# Patient Record
Sex: Female | Born: 1970 | Race: Black or African American | Hispanic: No | Marital: Single | Smoking: Current some day smoker
Health system: Southern US, Community
[De-identification: ages and names within clinical notes are randomized; demographics above are authoritative.]

## PROBLEM LIST (undated history)

## (undated) DIAGNOSIS — Z8042 Family history of malignant neoplasm of prostate: Secondary | ICD-10-CM

## (undated) DIAGNOSIS — Z780 Asymptomatic menopausal state: Secondary | ICD-10-CM

## (undated) DIAGNOSIS — M1990 Unspecified osteoarthritis, unspecified site: Secondary | ICD-10-CM

## (undated) DIAGNOSIS — Z803 Family history of malignant neoplasm of breast: Secondary | ICD-10-CM

## (undated) DIAGNOSIS — E785 Hyperlipidemia, unspecified: Secondary | ICD-10-CM

## (undated) DIAGNOSIS — R7303 Prediabetes: Secondary | ICD-10-CM

## (undated) DIAGNOSIS — D649 Anemia, unspecified: Secondary | ICD-10-CM

## (undated) DIAGNOSIS — Z8052 Family history of malignant neoplasm of bladder: Secondary | ICD-10-CM

## (undated) DIAGNOSIS — R42 Dizziness and giddiness: Secondary | ICD-10-CM

## (undated) DIAGNOSIS — F419 Anxiety disorder, unspecified: Secondary | ICD-10-CM

## (undated) DIAGNOSIS — T7840XA Allergy, unspecified, initial encounter: Secondary | ICD-10-CM

## (undated) DIAGNOSIS — N809 Endometriosis, unspecified: Secondary | ICD-10-CM

## (undated) DIAGNOSIS — F338 Other recurrent depressive disorders: Secondary | ICD-10-CM

## (undated) DIAGNOSIS — H9193 Unspecified hearing loss, bilateral: Secondary | ICD-10-CM

## (undated) HISTORY — DX: Unspecified hearing loss, bilateral: H91.93

## (undated) HISTORY — DX: Other recurrent depressive disorders: F33.8

## (undated) HISTORY — DX: Prediabetes: R73.03

## (undated) HISTORY — DX: Family history of malignant neoplasm of bladder: Z80.52

## (undated) HISTORY — DX: Anxiety disorder, unspecified: F41.9

## (undated) HISTORY — PX: SALPINGECTOMY: SHX328

## (undated) HISTORY — PX: OOPHORECTOMY: SHX6387

## (undated) HISTORY — DX: Unspecified osteoarthritis, unspecified site: M19.90

## (undated) HISTORY — DX: Endometriosis, unspecified: N80.9

## (undated) HISTORY — DX: Asymptomatic menopausal state: Z78.0

## (undated) HISTORY — DX: Dizziness and giddiness: R42

## (undated) HISTORY — PX: CERVICAL BIOPSY  W/ LOOP ELECTRODE EXCISION: SUR135

## (undated) HISTORY — DX: Anemia, unspecified: D64.9

## (undated) HISTORY — DX: Allergy, unspecified, initial encounter: T78.40XA

## (undated) HISTORY — DX: Hyperlipidemia, unspecified: E78.5

## (undated) HISTORY — DX: Family history of malignant neoplasm of breast: Z80.3

## (undated) HISTORY — DX: Family history of malignant neoplasm of prostate: Z80.42

---

## 1999-07-12 ENCOUNTER — Other Ambulatory Visit: Admission: RE | Admit: 1999-07-12 | Discharge: 1999-07-12 | Payer: Self-pay | Admitting: Obstetrics and Gynecology

## 1999-07-12 ENCOUNTER — Other Ambulatory Visit: Admission: RE | Admit: 1999-07-12 | Discharge: 1999-07-12 | Payer: Self-pay | Admitting: *Deleted

## 1999-11-01 ENCOUNTER — Encounter: Payer: Self-pay | Admitting: Obstetrics and Gynecology

## 1999-11-01 ENCOUNTER — Ambulatory Visit (HOSPITAL_COMMUNITY): Admission: RE | Admit: 1999-11-01 | Discharge: 1999-11-01 | Payer: Self-pay | Admitting: Obstetrics and Gynecology

## 2000-08-15 ENCOUNTER — Other Ambulatory Visit: Admission: RE | Admit: 2000-08-15 | Discharge: 2000-08-15 | Payer: Self-pay | Admitting: *Deleted

## 2000-10-20 ENCOUNTER — Observation Stay (HOSPITAL_COMMUNITY): Admission: AD | Admit: 2000-10-20 | Discharge: 2000-10-21 | Payer: Self-pay | Admitting: Obstetrics & Gynecology

## 2000-10-20 ENCOUNTER — Encounter (INDEPENDENT_AMBULATORY_CARE_PROVIDER_SITE_OTHER): Payer: Self-pay | Admitting: Specialist

## 2000-12-23 ENCOUNTER — Ambulatory Visit (HOSPITAL_COMMUNITY): Admission: RE | Admit: 2000-12-23 | Discharge: 2000-12-23 | Payer: Self-pay | Admitting: Obstetrics & Gynecology

## 2000-12-23 ENCOUNTER — Encounter: Payer: Self-pay | Admitting: Obstetrics & Gynecology

## 2001-08-14 ENCOUNTER — Other Ambulatory Visit: Admission: RE | Admit: 2001-08-14 | Discharge: 2001-08-14 | Payer: Self-pay | Admitting: Obstetrics and Gynecology

## 2002-08-19 ENCOUNTER — Other Ambulatory Visit: Admission: RE | Admit: 2002-08-19 | Discharge: 2002-08-19 | Payer: Self-pay | Admitting: Obstetrics and Gynecology

## 2007-02-14 ENCOUNTER — Inpatient Hospital Stay (HOSPITAL_COMMUNITY): Admission: AD | Admit: 2007-02-14 | Discharge: 2007-02-14 | Payer: Self-pay | Admitting: Obstetrics and Gynecology

## 2007-12-17 ENCOUNTER — Ambulatory Visit (HOSPITAL_COMMUNITY): Admission: RE | Admit: 2007-12-17 | Discharge: 2007-12-17 | Payer: Self-pay | Admitting: Obstetrics and Gynecology

## 2009-05-27 IMAGING — US US OB COMP LESS 14 WK
1 series · 14 of 28 positions shown · non-contrast
Comparison: none

CLINICAL DATA: Abnormal uterine bleeding. 
 OBSTETRICAL ULTRASOUND <14 WKS AND TRANSVAGINAL OB US:
TECHNIQUE: Both transabdominal and transvaginal ultrasound examinations were performed for complete evaluation of the gestation as well as the maternal uterus, adnexal regions, and pelvic cul-de-sac.

[Series 1: us ob comp +14 wk · 14 of 48 slices shown]
[im 2/48]
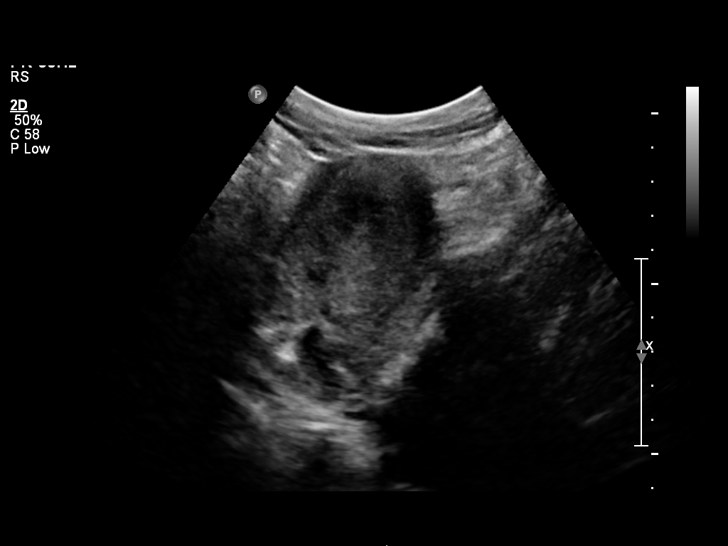
[im 6/48]
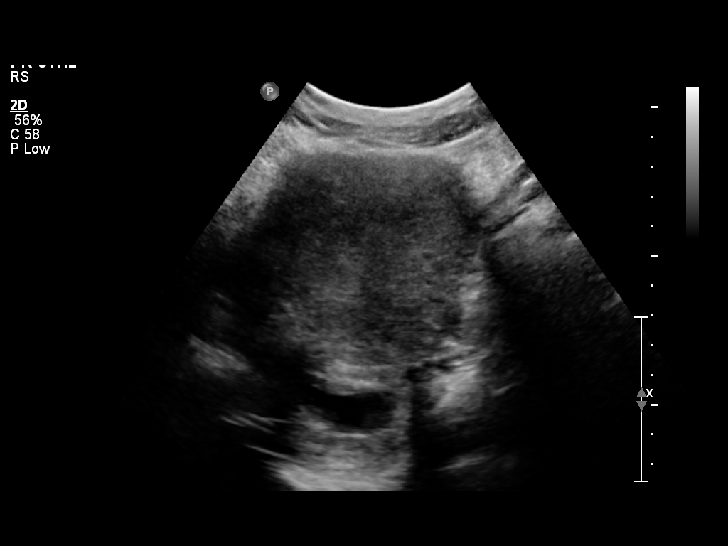
[im 9/48]
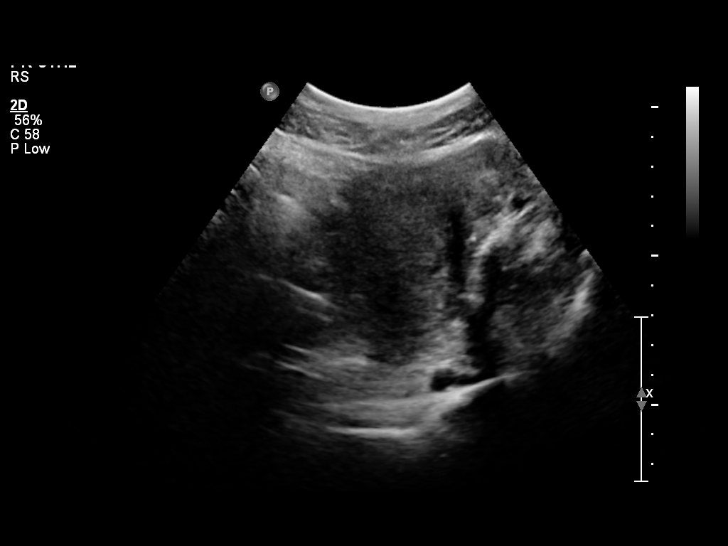
[im 13/48]
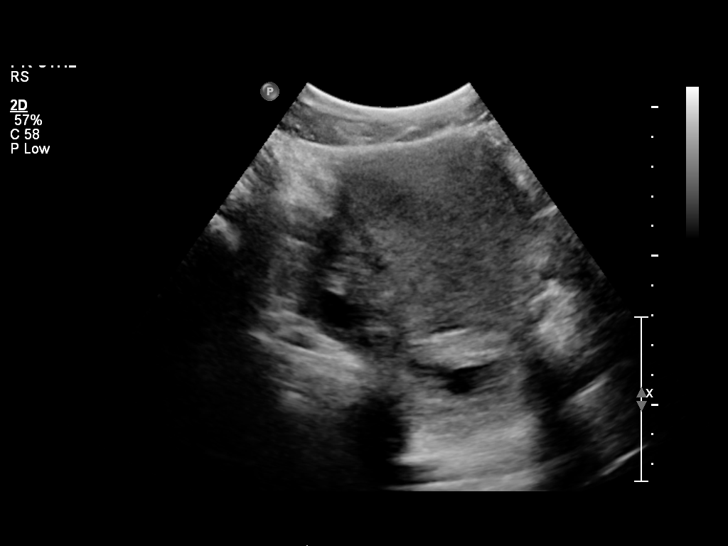
[im 16/48]
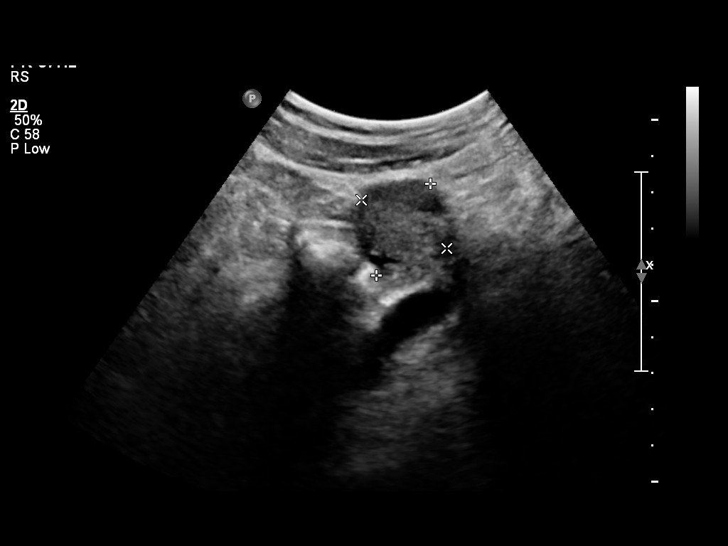
[im 20/48]
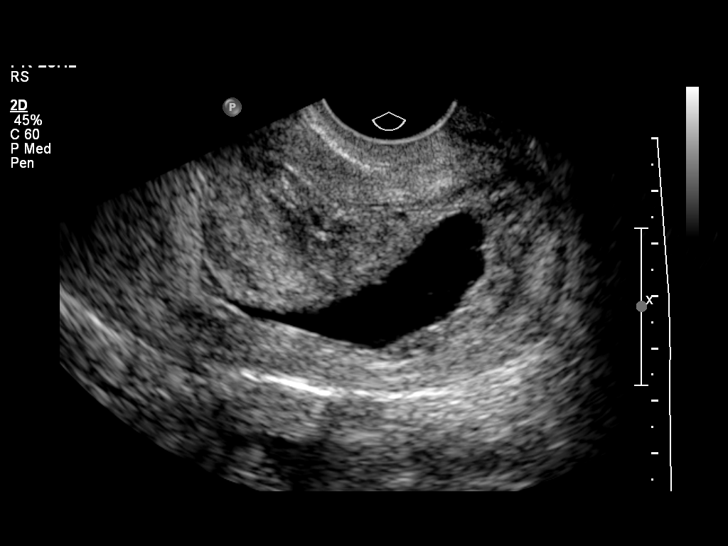
[im 23/48]
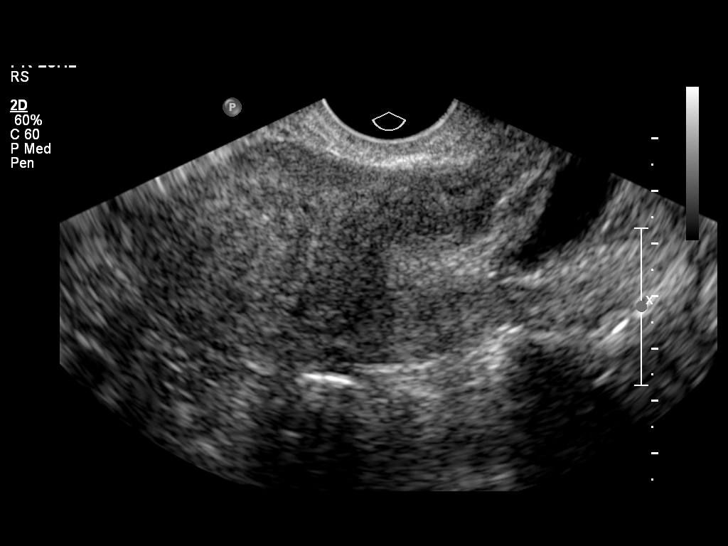
[im 27/48]
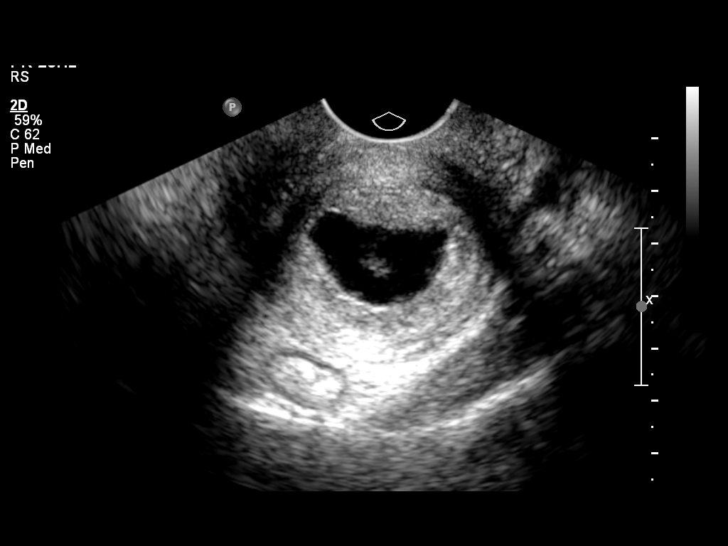
[im 30/48]
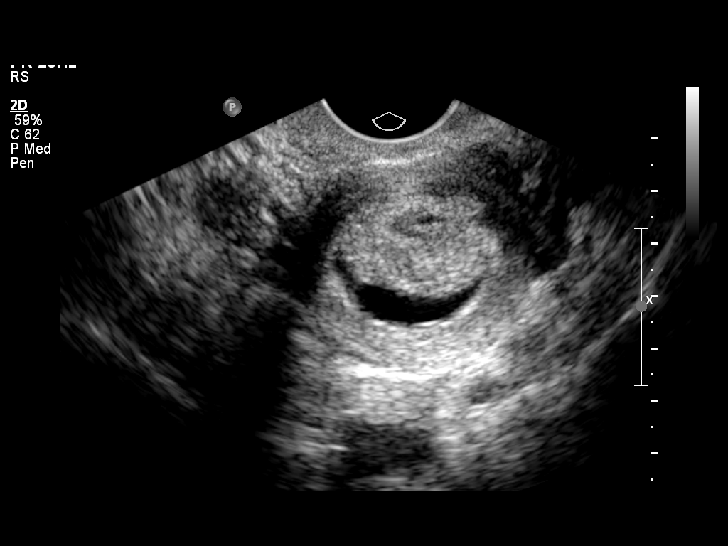
[im 34/48]
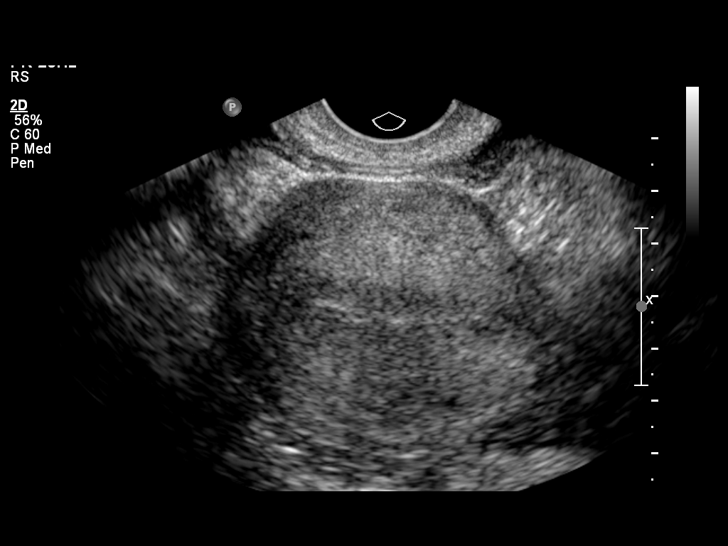
[im 37/48]
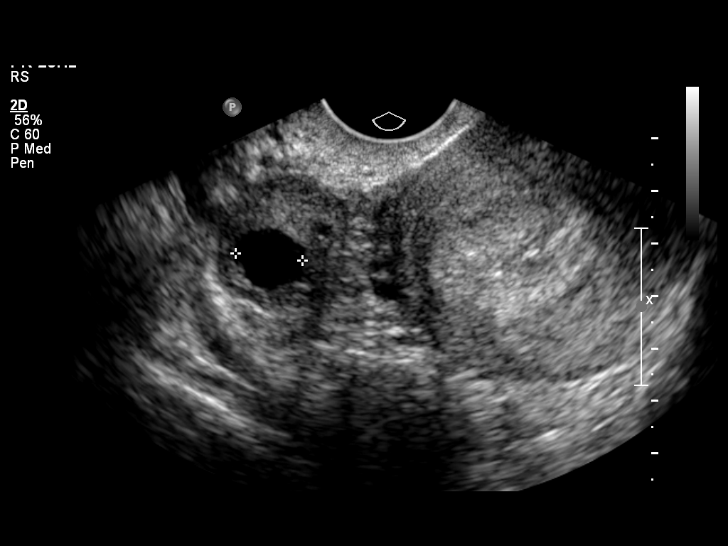
[im 41/48]
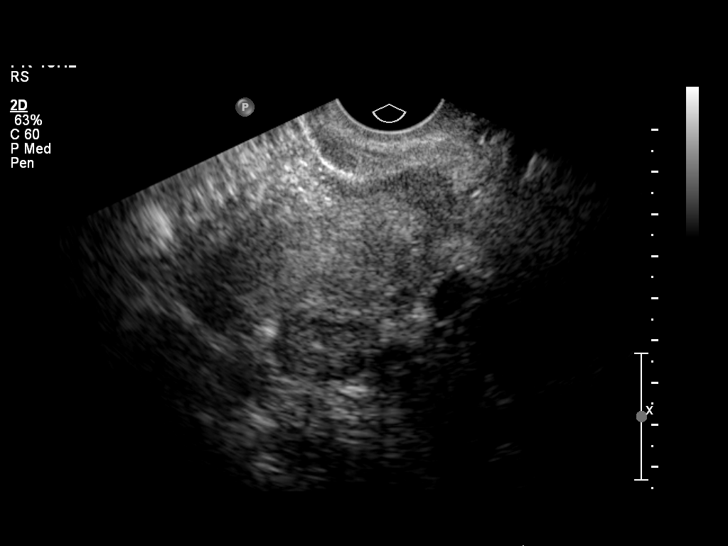
[im 44/48]
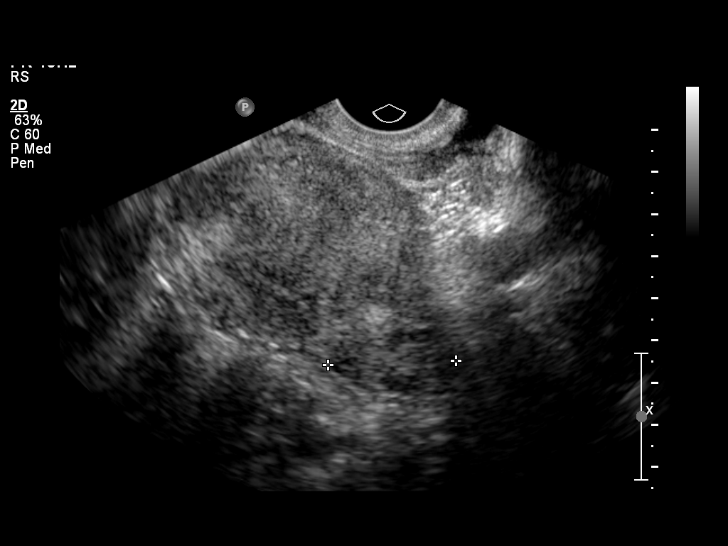
[im 48/48]
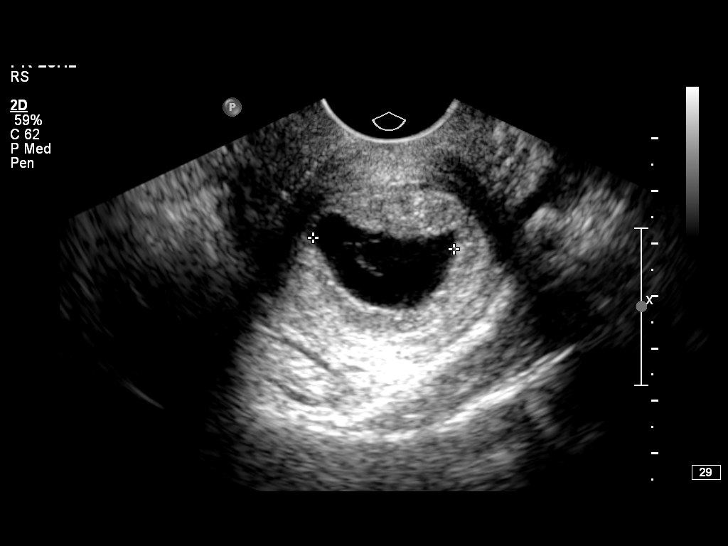

[14 of 28 positions shown; findings below may reference images not displayed]

FINDINGS: There is a single intrauterine gestational sac.  The sac is deformed and extended into the lower uterine segment with the sac extending into slightly dilated upper cervix.  Yolk sac is visible, but there is no visible embryo and no cardiac activity. Mean sac diameter is 2.9 cm consistent with 8 weeks 1 day.  
 The ovaries are normal with a corpus luteum cyst on the right.  No free fluid.
IMPRESSION: Spontaneous abortion in progress.  No visible fetus.  Gestational sac is in the dilated lower uterine segment and upper cervix.

## 2009-09-18 ENCOUNTER — Ambulatory Visit (HOSPITAL_COMMUNITY): Admission: RE | Admit: 2009-09-18 | Discharge: 2009-09-18 | Payer: Self-pay | Admitting: Obstetrics and Gynecology

## 2010-07-20 NOTE — Op Note (Signed)
[facility]  Patient:    NIKOLETTE, REINDL Visit Number: [id number] MRN: [id number]          Service Type: DSU Location: [id number] Attending Physician:  Genia Del Proc. Date: 10/20/00 Adm. Date:  10/20/2000                             Operative Report  DATE OF BIRTH:                December 19, 1970.  PREOPERATIVE DIAGNOSES:       1. Left adnexal mass, probable hydrosalpinx.                               2. Left pelvic pain.  POSTOPERATIVE DIAGNOSES:      1. Left adnexal mass, probable hydrosalpinx.                               2. Left pelvic pain.                               3. Left hydrosalpinx with adhesions                                  (more than 3 cm in diameter).                               4. Right mild hydrosalpinx.                               5. Bilateral proximal tubal obstructions.                               6. Left ovarian cyst (around 5 cm).                               7. Epiploic parietal and left epiploic adnexal,                                  left intestinal adnexal, and bilateral                                  tubo-ovarian adhesions. Adhesions were severe                                  and dense.  OPERATION:                    1. Diagnostic laparoscopy.                               2. Left salpingectomy.  3. Left ovarian cystectomy.                               4. Extensive lysis of adhesions.                               5. Chromopertubation with methylene blue.   SURGEON:                      Genia Del, M.D.  ASSISTANT:                    Pershing Cox, M.D.  ANESTHESIOLOGIST:             Gretta Cool., M.D.  ANESTHESIA:                   General.  ESTIMATED BLOOD LOSS:         Less than 100 cc.  DESCRIPTION OF PROCEDURE:     Under general anesthesia with endotracheal intubation, the patient is in lithotomy position for operative  laparoscopy. She is prepped with Betadine on the abdominal, suprapubic, vulvar, and vaginal areas. The vaginal exam reveals an anteverted uterus, mobile. The right adnexa is normal. The left adnexa is increased in volume, about 9 cm, but soft. The speculum is inserted. The anterior lip of the cervix is grasped with a tenaculum and the uterus is cannulated. The speculum is removed. Abdominally an infraumbilical incision is made with a scalpel over 10 mm. The Veress needle is inserted while raising the abdominal wall. The security tests are done and a pneumoperitoneum is created with around 3 liters of CO2. Once achieved, the Veress needle is removed. The trocar is inserted and then the laparoscope. Exploration of the pelvic cavity reveals extensive adhesions involving the epiploon with the parietal wall anteriorly and to the right. The epiploon is also adherent to the left adnexa. 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 We then make a suprapubic incision over a 5 mm with the scalpel and insert a 5 mm trocar. We make a third incision in the left inguinal area with a scalpel and then introduce another 5 mm trocar. The instruments are inserted and atraumatic clamp and the Rotary scissors with unipolar. We then proceed with lysis of adhesions between the epiploon and the parietal wall. We use the unipolar when necessary. We then have a better vision of the pelvic cavity. The uterus is normal in appearance and size. The left adnexa is adherent to the epiploon and to the sigmoid. We proceed to lyse those adhesions. We then have a severe hydrosalpinx on that side with an ovarian cyst. The left tube is severely adherent to the ovary. On the right side, the hydrosalpinx is mild but the tube is adherent to the ovary. The ovary is normal in appearance and size. We lyse the adhesions on the right side  and that gives some motility to the tube, although no fimbria are seen and the distal end appears closed.  We proceed with hydrotubation with methylene blue and absolutely no methylene blue passes in the tube on either side. There is a proximal obstruction bilaterally. The decision is therefore taken to proceed with left salpingectomy. We use the tripolar cauterizing, cutting successively. We also proceed with  the left ovarian cystectomy. Clear fluid is present inside the ovarian cyst. The specimen is put in a bag and is removed through a 10 mm trocar at the suprapubic area. Hemostasis is adequate on the left ovary.  We then proceed with hydrotubation with methylene blue once more and confirm a proximal obstruction of the right tube. A distal tuboplasty is therefore not done. Hemostasis is adequate in the pelvic cavity. Irrigation and suction was done. We remove the instruments, evacuate the CO2, and pictures were taken. The estimated blood loss was less than 100 cc. No complication occurred and the patient was transferred to recovery room in good status. Attending Physician:  Genia Del DD:  10/20/00 TD:  10/21/00 Job: [id number]

## 2010-12-10 LAB — WET PREP, GENITAL
Trich, Wet Prep: NONE SEEN
Yeast Wet Prep HPF POC: NONE SEEN

## 2010-12-10 LAB — CBC
HCT: 27.7 — ABNORMAL LOW
Hemoglobin: 9.8 — ABNORMAL LOW
MCHC: 35.4
RDW: 14.2

## 2010-12-10 LAB — POCT PREGNANCY, URINE
Operator id: 140111
Preg Test, Ur: POSITIVE

## 2010-12-10 LAB — URINALYSIS, ROUTINE W REFLEX MICROSCOPIC
Bilirubin Urine: NEGATIVE
Ketones, ur: NEGATIVE
Nitrite: NEGATIVE
pH: 6

## 2010-12-10 LAB — HCG, QUANTITATIVE, PREGNANCY: hCG, Beta Chain, Quant, S: 11929 — ABNORMAL HIGH

## 2010-12-10 LAB — URINE MICROSCOPIC-ADD ON

## 2011-09-30 ENCOUNTER — Other Ambulatory Visit (HOSPITAL_COMMUNITY): Payer: Self-pay | Admitting: Obstetrics and Gynecology

## 2011-09-30 DIAGNOSIS — Z1231 Encounter for screening mammogram for malignant neoplasm of breast: Secondary | ICD-10-CM

## 2011-10-17 ENCOUNTER — Ambulatory Visit (HOSPITAL_COMMUNITY)
Admission: RE | Admit: 2011-10-17 | Discharge: 2011-10-17 | Disposition: A | Discharge status: Home / Self Care | Payer: Self-pay | Source: Ambulatory Visit | Attending: Obstetrics and Gynecology | Admitting: Obstetrics and Gynecology

## 2011-10-17 DIAGNOSIS — Z1231 Encounter for screening mammogram for malignant neoplasm of breast: Secondary | ICD-10-CM

## 2013-02-19 ENCOUNTER — Other Ambulatory Visit (HOSPITAL_COMMUNITY): Payer: Self-pay | Admitting: Obstetrics and Gynecology

## 2013-02-19 DIAGNOSIS — Z1231 Encounter for screening mammogram for malignant neoplasm of breast: Secondary | ICD-10-CM

## 2013-03-16 ENCOUNTER — Ambulatory Visit (HOSPITAL_COMMUNITY)
Admission: RE | Admit: 2013-03-16 | Discharge: 2013-03-16 | Disposition: A | Discharge status: Home / Self Care | Payer: Medicaid Other | Source: Ambulatory Visit | Attending: Obstetrics and Gynecology | Admitting: Obstetrics and Gynecology

## 2013-03-16 DIAGNOSIS — Z1231 Encounter for screening mammogram for malignant neoplasm of breast: Secondary | ICD-10-CM

## 2015-04-10 ENCOUNTER — Emergency Department (HOSPITAL_COMMUNITY)
Admission: EM | Admit: 2015-04-10 | Discharge: 2015-04-10 | Disposition: A | Discharge status: Home / Self Care | Payer: Medicaid Other | Attending: Emergency Medicine | Admitting: Emergency Medicine

## 2015-04-10 ENCOUNTER — Encounter (HOSPITAL_COMMUNITY): Payer: Self-pay | Admitting: *Deleted

## 2015-04-10 DIAGNOSIS — H9192 Unspecified hearing loss, left ear: Secondary | ICD-10-CM | POA: Insufficient documentation

## 2015-04-10 DIAGNOSIS — F17210 Nicotine dependence, cigarettes, uncomplicated: Secondary | ICD-10-CM | POA: Insufficient documentation

## 2015-04-10 MED ORDER — AMOXICILLIN 500 MG PO CAPS: 500.0000 mg | ORAL_CAPSULE | Freq: Three times a day (TID) | ORAL | Status: DC

## 2015-04-10 MED FILL — AMOXICILLIN 500 MG CAPSULE: 500 | 10 days supply | Qty: 30 | Fill #0

## 2015-04-10 NOTE — Care Management Note (Signed)
Case Management Note  Patient Details  Name: Yolanda Keller MRN: [id number] Date of Birth: 25-Sep-1970  Subjective/Objective:                  45 y.o. female with a hx of vertigo who presents to the Emergency Department complaining of bilateral hearing loss for the past one week that worsened today/  Home with child.  Action/Plan: Follow for disposition needs.   Expected Discharge Date:        04/10/15          Expected Discharge Plan:  Home/Self Care  In-House Referral:     Discharge planning Services  CM Consult, Follow-up appt scheduled, GCCN / P4HM (established/new)  Post Acute Care Choice:  NA Choice offered to:  Patient  DME Arranged:  N/A DME Agency:  NA  HH Arranged:  NA HH Agency:  NA  Status of Service:  Completed, signed off  Medicare Important Message Given:    Date Medicare IM Given:    Medicare IM give by:    Date Additional Medicare IM Given:    Additional Medicare Important Message give by:     If discussed at [place] of Stay Meetings, dates discussed:    Additional Comments: Yolanda Keller J. Clydene Laming, RN, BSN, [place] [phone] ED CM consulted regarding PCP establishment and insurance enrollment. Pt presented to [facility] ED today with hearing loss. NCM met with pt at bedside; pt confirms not having access to f/u care with PCP or insurance coverage. Discussed with patient importance and benefits of establishing PCP, and not utilizing the ED for primary care needs. Pt verbalized understanding and is in agreement. Discussed other options, provided list of local  affordable PCPs.  Pt voiced interest in the [facility] and [place].  NCM advised that [facility]  Internal Medicine providers are seeing pts at [facility]. Pt verbalized understanding. NCM set up appointment at [facility] with Cammie Sickle, NP on 05/09/15 at 2:15.  NCM informed pt they may visit [place] and [place] for Rx needs upon discharge.  Saintclair Halsted, [place] Specialist visited pt at bedside to speak with pt about orange card application process.  Pt verbalized understanding.  Fuller Mandril, RN 04/10/2015, 3:11 PM

## 2015-04-10 NOTE — Discharge Instructions (Signed)
Hearing Loss Hearing loss is a partial or total loss of the ability to hear. This can be temporary or permanent, and it can happen in one or both ears. Hearing loss may be referred to as deafness. Medical care is necessary to treat hearing loss properly and to prevent the condition from getting worse. Your hearing may partially or completely come back, depending on what caused your hearing loss and how severe it is. In some cases, hearing loss is permanent. CAUSES Common causes of hearing loss include:   Too much wax in the ear canal.   Infection of the ear canal or middle ear.   Fluid in the middle ear.   Injury to the ear or surrounding area.   An object stuck in the ear.   Prolonged exposure to loud sounds, such as music.  Less common causes of hearing loss include:   Tumors in the ear.   Viral or bacterial infections, such as meningitis.   A hole in the eardrum (perforated eardrum).  Problems with the hearing nerve that sends signals between the brain and the ear.  Certain medicines.  SYMPTOMS  Symptoms of this condition may include:  Difficulty telling the difference between sounds.  Difficulty following a conversation when there is background noise.  Lack of response to sounds in your environment. This may be most noticeable when you do not respond to startling sounds.  Needing to turn up the volume on the television, radio, etc.  Ringing in the ears.  Dizziness.  Pain in the ears. DIAGNOSIS This condition is diagnosed based on a physical exam and a hearing test (audiometry). The audiometry test will be performed by a hearing specialist (audiologist). You may also be referred to an ear, nose, and throat (ENT) specialist (otolaryngologist).  TREATMENT Treatment for recent onset of hearing loss may include:   Ear wax removal.   Being prescribed medicines to prevent infection (antibiotics).   Being prescribed medicines to reduce inflammation  (corticosteroids).  HOME CARE INSTRUCTIONS  If you were prescribed an antibiotic medicine, take it as told by your health care provider. Do not stop taking the antibiotic even if you start to feel better.  Take over-the-counter and prescription medicines only as told by your health care provider.  Avoid loud noises.   Return to your normal activities as told by your health care provider. Ask your health care provider what activities are safe for you.  Keep all follow-up visits as told by your health care provider. This is important. SEEK MEDICAL CARE IF:   You feel dizzy.   You develop new symptoms.   You vomit or feel nauseous.   You have a fever.  SEEK IMMEDIATE MEDICAL CARE IF:  You develop sudden changes in your vision.   You have severe ear pain.   You have new or increased weakness.  You have a severe headache.   This information is not intended to replace advice given to you by your health care provider. Make sure you discuss any questions you have with your health care provider.   Document Released: 02/18/2005 Document Revised: 11/09/2014 Document Reviewed: 07/06/2014 Elsevier Interactive Patient Education 2016 Elsevier Inc. Meniere Disease Meniere disease is an inner ear disorder. It causes attacks of a spinning sensation (vertigo) and ringing in the ear (tinnitus). It also causes hearing loss and a sensation of fullness or pressure in your ear.  Meniere disease is lifelong. It may get worse over time. Symptoms usually begin in one ear but may  eventually affect both ears.  CAUSES Meniere disease is caused by having too much of the fluid that is in your inner ear (endolymph). When endolymph builds up in your inner ear, it affects the nerves that control balance and hearing. The reason for the endolymph buildup is not known. Possible causes include:  Allergy.  An abnormal reaction of the body's defense system (autoimmune disease).  Viral infection of the  inner ear.  Head injury. RISK FACTORS  Age older than 40 years.  Family history of Meniere disease.  History of autoimmune disease.  History of migraine headaches. SIGNS AND SYMPTOMS Symptoms of Meniere disease can come and go and may last for up to 4 hours at a time. Symptoms usually start in one ear and may become more frequent and eventually involve both ears. Symptoms can include:  Fullness and pressure in your ear.  Roaring or ringing in your ear.  Vertigo and loss of balance.  Decreased hearing.  Nausea and vomiting. DIAGNOSIS Your health care provider will perform a physical exam. Tests may be done to confirm a diagnosis of Meniere disease. These tests may include:  A hearing test (audiogram).  An electronystagmogram. This tests your balance nerve (vestibular nerve).  Imaging studies, such as CT or MRI, of your inner ear. TREATMENT There is no cure for Meniere disease, but it can be managed. Management may include:  A diet that may help relieve symptoms of Meniere disease.  Use of medicines to reduce:  Vertigo.  Nausea.  Fluid retention.  Use of an air pressure pulse generator. This is a machine that sends small pressure pulses into your ear canal.  Inner ear surgery (rare). When you experience symptoms, it can be helpful to lie down on a flat surface and focus your eyes on one object that does not move. Try to stay in that position until your symptoms go away.  HOME CARE INSTRUCTIONS   Take medicines only as directed by your health care provider.  Eat the same amount of food at the same time every day, including snacks.  Do not skip meals.  Limit the salt in your diet to 1,000 mg a day.  Avoid caffeine.  Limit alcoholic drinks to one drink a day.  Do not eat foods containing monosodium glutamate (MSG).  Drink enough fluids to keep your urine clear or pale yellow.  Do not use any tobacco products including cigarettes, chewing tobacco, or  electronic cigarettes. If you need help quitting, ask your health care provider.  Find ways to reduce or avoid stress. SEEK MEDICAL CARE IF:   You have symptoms that last longer than 4 hours.  You have new or more severe symptoms. SEEK IMMEDIATE MEDICAL CARE IF:   You have been vomiting for 24 hours.  You are not able to keep fluids down.  You have chest pain or trouble breathing.   This information is not intended to replace advice given to you by your health care provider. Make sure you discuss any questions you have with your health care provider.   Document Released: 02/16/2000 Document Revised: 03/11/2014 Document Reviewed: 02/01/2013 Elsevier Interactive Patient Education 2016 Elsevier Inc. Otitis Media With Effusion Otitis media with effusion is the presence of fluid in the middle ear. This is a common problem in children, which often follows ear infections. It may be present for weeks or longer after the infection. Unlike an acute ear infection, otitis media with effusion refers only to fluid behind the ear drum and not  infection. Children with repeated ear and sinus infections and allergy problems are the most likely to get otitis media with effusion. CAUSES  The most frequent cause of the fluid buildup is dysfunction of the eustachian tubes. These are the tubes that drain fluid in the ears to the back of the nose (nasopharynx). SYMPTOMS   The main symptom of this condition is hearing loss. As a result, you or your child may:  Listen to the TV at a loud volume.  Not respond to questions.  Ask "what" often when spoken to.  Mistake or confuse one sound or word for another.  There may be a sensation of fullness or pressure but usually not pain. DIAGNOSIS   Your health care provider will diagnose this condition by examining you or your child's ears.  Your health care provider may test the pressure in you or your child's ear with a tympanometer.  A hearing test may be  conducted if the problem persists. TREATMENT   Treatment depends on the duration and the effects of the effusion.  Antibiotics, decongestants, nose drops, and cortisone-type drugs (tablets or nasal spray) may not be helpful.  Children with persistent ear effusions may have delayed language or behavioral problems. Children at risk for developmental delays in hearing, learning, and speech may require referral to a specialist earlier than children not at risk.  You or your child's health care provider may suggest a referral to an ear, nose, and throat surgeon for treatment. The following may help restore normal hearing:  Drainage of fluid.  Placement of ear tubes (tympanostomy tubes).  Removal of adenoids (adenoidectomy). HOME CARE INSTRUCTIONS   Avoid secondhand smoke.  Infants who are breastfed are less likely to have this condition.  Avoid feeding infants while they are lying flat.  Avoid known environmental allergens.  Avoid people who are sick. SEEK MEDICAL CARE IF:   Hearing is not better in 3 months.  Hearing is worse.  Ear pain.  Drainage from the ear.  Dizziness. MAKE SURE YOU:   Understand these instructions.  Will watch your condition.  Will get help right away if you are not doing well or get worse.   This information is not intended to replace advice given to you by your health care provider. Make sure you discuss any questions you have with your health care provider.   Document Released: 03/28/2004 Document Revised: 03/11/2014 Document Reviewed: 09/15/2012 Elsevier Interactive Patient Education [employer]

## 2015-04-10 NOTE — ED Provider Notes (Signed)
CSN: [id number]     Arrival date & time 04/10/15  1325 History  By signing my name below, I, Linus Galas, attest that this documentation has been prepared under the direction and in the presence of Arthor Captain, PA-C. Electronically Signed: Linus Galas, ED Scribe. 04/10/2015. 2:33 PM.  The history is provided by the patient. No language interpreter was used.   HPI Comments:, Yolanda Keller is a 45 y.o. female with a hx of vertigo who presents to the Emergency Department complaining of bilateral hearing loss for the past one week that worsened today, PTA. She states she has greater hearing loss in her left ear than her right. Pt reports that she feels as if her ears are clogged despite keeping them clean to the best of her ability. Pt denies any exacerbated or alleviating factors. She tried using "ear drops" to alleviate her sx with no relief. Pt denies any fevers, nasal congestion, dizziness, visual changes, difficulty swallowing, one-sided weakness, or any other sx.   Pt states she may have seen a doctor for her vertigo a long time ago and does not know what type of vertigo she has. Pt denies being prescribed any medication for her vertigo.   History reviewed. No pertinent past medical history. Past Surgical History  Procedure Laterality Date  . Oophorectomy Left    History reviewed. No pertinent family history. Social History  Substance Use Topics  . Smoking status: Current Some Day Smoker    Types: Cigars  . Smokeless tobacco: Never Used  . Alcohol Use: Yes     Comment: social   OB History    No data available     Review of Systems  Constitutional: Negative for fever.  HENT: Positive for hearing loss. Negative for congestion, ear pain and trouble swallowing.   Eyes: Negative for visual disturbance.  Neurological: Negative for weakness.  Psychiatric/Behavioral: Negative for confusion.   Allergies  Review of patient's allergies indicates not on file.  Home  Medications   Prior to Admission medications   Medication Sig Start Date End Date Taking? Authorizing Provider  amoxicillin (AMOXIL) 500 MG capsule Take 1 capsule (500 mg total) by mouth 3 (three) times daily. 04/10/15   Arthor Captain, PA-C   BP 107/67 mmHg  Pulse 77  Temp(Src) 98.1 F (36.7 C) (Oral)  Resp 20  SpO2 99%  LMP 04/03/2015 Physical Exam  Constitutional: She is oriented to person, place, and time. She appears well-developed and well-nourished.  HENT:  Head: Normocephalic and atraumatic.  Right TM normal Left TM is dull and erythematous with air fluid behind it.   Cardiovascular: Normal rate.   Pulmonary/Chest: Effort normal.  Abdominal: She exhibits no distension.  Neurological: She is alert and oriented to person, place, and time.  Speech is clear and goal oriented, follows commands Major Cranial nerves without deficit, no facial droop Normal strength in upper and lower extremities bilaterally including dorsiflexion and plantar flexion, strong and equal grip strength Sensation normal to light and sharp touch Moves extremities without ataxia, coordination intact Normal finger to nose and rapid alternating movements Neg romberg, no pronator drift Normal gait Normal heel-shin and balance   Skin: Skin is warm and dry.  Psychiatric: She has a normal mood and affect.  Nursing note and vitals reviewed.   ED Course  Procedures  DIAGNOSTIC STUDIES: Oxygen Saturation is 99% on room air, normal by my interpretation.    COORDINATION OF CARE: 2:25 PM Will give Rx for Amoxicillin. Discussed treatment plan with  pt at bedside and pt agreed to plan.  Labs Review Labs Reviewed - No data to display  Imaging Review No results found.   EKG Interpretation None      MDM   Final diagnoses:  Difficulty hearing, left   Patient with decreased hearing. Likely mennieres given her hx. ? Effusion. Will treat for infection follow up with ENT. No other neuro deficits  I  personally performed the services described in this documentation, which was scribed in my presence. The recorded information has been reviewed and is accurate.      Arthor Captain, PA-C 04/12/15 1930  Jerelyn Scott, MD 05/10/15 [phone]

## 2015-04-10 NOTE — ED Notes (Signed)
Pt reports change in hearing to Lt ear.

## 2015-05-09 ENCOUNTER — Encounter: Payer: Self-pay | Admitting: Family Medicine

## 2015-05-09 ENCOUNTER — Ambulatory Visit (INDEPENDENT_AMBULATORY_CARE_PROVIDER_SITE_OTHER): Payer: No Typology Code available for payment source | Admitting: Family Medicine

## 2015-05-09 VITALS — BP 101/69 | HR 79 | Temp 98.0°F | Resp 18 | Ht 67.0 in | Wt 154.0 lb

## 2015-05-09 DIAGNOSIS — Z8669 Personal history of other diseases of the nervous system and sense organs: Secondary | ICD-10-CM

## 2015-05-09 DIAGNOSIS — K029 Dental caries, unspecified: Secondary | ICD-10-CM

## 2015-05-09 DIAGNOSIS — G629 Polyneuropathy, unspecified: Secondary | ICD-10-CM

## 2015-05-09 DIAGNOSIS — Z833 Family history of diabetes mellitus: Secondary | ICD-10-CM

## 2015-05-09 DIAGNOSIS — H9193 Unspecified hearing loss, bilateral: Secondary | ICD-10-CM

## 2015-05-09 DIAGNOSIS — R631 Polydipsia: Secondary | ICD-10-CM

## 2015-05-09 DIAGNOSIS — Z87898 Personal history of other specified conditions: Secondary | ICD-10-CM | POA: Insufficient documentation

## 2015-05-09 DIAGNOSIS — R634 Abnormal weight loss: Secondary | ICD-10-CM

## 2015-05-09 LAB — POCT URINALYSIS DIP (DEVICE)
Bilirubin Urine: NEGATIVE
Glucose, UA: NEGATIVE mg/dL
HGB URINE DIPSTICK: NEGATIVE
KETONES UR: NEGATIVE mg/dL
Leukocytes, UA: NEGATIVE
NITRITE: NEGATIVE
PROTEIN: NEGATIVE mg/dL
SPECIFIC GRAVITY, URINE: 1.025 (ref 1.005–1.030)
UROBILINOGEN UA: 0.2 mg/dL (ref 0.0–1.0)
pH: 5.5 (ref 5.0–8.0)

## 2015-05-09 LAB — TSH: TSH: 1.18 mIU/L

## 2015-05-09 NOTE — Progress Notes (Signed)
Subjective:    Patient ID: Yolanda Keller, female    DOB: February 12, 1971, 45 y.o.   MRN: [id number]  HPI Ms. Yolanda CornerKeshea Keller, a 45 year old female presents to establish care. Patient states that she has not had a primary provider due to insurance constraints. She has been utilizing urgent care or the emergency department for all primary needs. Ms. Yolanda Keller is complaining of bilateral hearing loss for greater than 1 month. She states that hearing loss in the left ear is greater than the right. She was evaluated in the emergency department on 04/10/2015. She endorses a history of vertigo, but denies headache, fever, fatigue, ear pain, sinus pain/pressure, environmental allergies, dizziness, syncope,  Weakness, or palpitations. She maintains that she does not use ear buds/headphones, and does not use Q-tips. She states that she attempted OTC ear drops to alleviate symptoms without relief. She was also started on amoxicillin in the emergency department without relief. She does not endorse a history of meningitis, or strep throat. She also denies head injury  There has not been a history of a neck mass.  Past Medical History  Diagnosis Date  . Hearing loss of both ears    .No Known Allergies  Social History   Social History  . Marital Status: Single    Spouse Name: N/A  . Number of Children: N/A  . Years of Education: N/A   Occupational History  . Not on file.   Social History Main Topics  . Smoking status: Current Some Day Smoker    Types: Cigars  . Smokeless tobacco: Never Used  . Alcohol Use: Yes     Comment: social  . Drug Use: No  . Sexual Activity: Not on file   Other Topics Concern  . Not on file   Social History Narrative   Past Surgical History  Procedure Laterality Date  . Oophorectomy Left    Review of Systems  Constitutional: Negative for fever, fatigue and unexpected weight change.  HENT: Positive for hearing loss (bilaterally).   Eyes: Negative.    Respiratory: Negative.  Negative for shortness of breath.   Cardiovascular: Negative.   Gastrointestinal: Negative.   Endocrine: Negative for polydipsia, polyphagia and polyuria.  Musculoskeletal: Negative.   Skin: Negative.   Allergic/Immunologic: Negative for environmental allergies and immunocompromised state.  Neurological: Positive for dizziness (history of vertigo). Negative for tremors, seizures, facial asymmetry, weakness and headaches.  Hematological: Negative.   Psychiatric/Behavioral: Negative.  Negative for suicidal ideas and sleep disturbance.       Depression-seasonal         Objective:   Physical Exam  Constitutional: She is oriented to person, place, and time. She appears well-developed and well-nourished.  HENT:  Head: Normocephalic and atraumatic.  Right Ear: External ear and ear canal normal. No drainage or swelling. No mastoid tenderness. Tympanic membrane is scarred (Landmarks not visualized. TM cloudy). Decreased hearing is noted.  Left Ear: External ear and ear canal normal. No drainage or swelling. No mastoid tenderness. Tympanic membrane is not injected and not scarred. Decreased hearing is noted.  Nose: Nose normal.  Mouth/Throat: Oropharynx is clear and moist.  Rinne: Bone conduction was heard longer than air conduction in ears bilaterally  Weber: Inconclusive  Patient responds at 40 db HL in ears bilaterally  Eyes: Conjunctivae and EOM are normal. Pupils are equal, round, and reactive to light.  Neck: Normal range of motion. Neck supple.  Cardiovascular: Normal rate, regular rhythm, normal heart sounds and intact distal pulses.  Pulmonary/Chest: Effort normal and breath sounds normal.  Abdominal: Soft. Bowel sounds are normal.  Musculoskeletal: Normal range of motion.  Neurological: She is alert and oriented to person, place, and time. She has normal reflexes.  Skin: Skin is warm and dry.  Psychiatric: She has a normal mood and affect. Her behavior  is normal. Judgment and thought content normal.      BP 101/69 mmHg  Pulse 79  Temp(Src) 98 F (36.7 C) (Oral)  Resp 18  Ht  (1.702 m)  Wt 154 lb (69.854 kg)  BMI 24.11 kg/m2  SpO2 100%  LMP 04/28/2015 (Exact Date) Assessment & Plan:  1. History of vertigo Orthostatic vital signs are within normal limits. She states that she has not had dizziness in greater than 1 month.  - Orthostatic vital signs  2. Decreased hearing of both ears Audiometry performed. Hearing diminished bilaterally. 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 She responds to 40 db HL in both ears per audioscope screening.  I will defer to ENT for further evaluation.  - Ambulatory referral to ENT  3. Polydipsia Patient has a history of type 2 diabetes in first degree family members.  - POCT urinalysis dipstick - Hemoglobin A1c - COMPLETE METABOLIC PANEL WITH GFR  4. Family history of diabetes mellitus - Hemoglobin A1c  5. Loss of weight  - TSH  6. Neuropathy (HCC)  - HIV antibody (with reflex)  7. Dental caries  - Ambulatory referral to Dentistry  Routine Health Maintenance: Recommend that patient return in 6 months for pap smear  Recommend screening mammogram Recommend a lowfat, low carbohydrate diet divided over 5-6 small meals, increase water intake to 6-8 glasses, and 150 minutes per week of cardiovascular exercise.    Yolanda Keller M, FNP    The patient was given clear instructions to go to ER or return to medical center if symptoms do not improve, worsen or new problems develop. The patient verbalized understanding. Will notify patient with laboratory results.

## 2015-05-09 NOTE — Patient Instructions (Signed)
Hearing Loss Hearing loss is a partial or total loss of the ability to hear. This can be temporary or permanent, and it can happen in one or both ears. Hearing loss may be referred to as deafness. Medical care is necessary to treat hearing loss properly and to prevent the condition from getting worse. Your hearing may partially or completely come back, depending on what caused your hearing loss and how severe it is. In some cases, hearing loss is permanent. CAUSES Common causes of hearing loss include:   Too much wax in the ear canal.   Infection of the ear canal or middle ear.   Fluid in the middle ear.   Injury to the ear or surrounding area.   An object stuck in the ear.   Prolonged exposure to loud sounds, such as music.  Less common causes of hearing loss include:   Tumors in the ear.   Viral or bacterial infections, such as meningitis.   A hole in the eardrum (perforated eardrum).  Problems with the hearing nerve that sends signals between the brain and the ear.  Certain medicines.  SYMPTOMS  Symptoms of this condition may include:  Difficulty telling the difference between sounds.  Difficulty following a conversation when there is background noise.  Lack of response to sounds in your environment. This may be most noticeable when you do not respond to startling sounds.  Needing to turn up the volume on the television, radio, etc.  Ringing in the ears.  Dizziness.  Pain in the ears. DIAGNOSIS This condition is diagnosed based on a physical exam and a hearing test (audiometry). The audiometry test will be performed by a hearing specialist (audiologist). You may also be referred to an ear, nose, and throat (ENT) specialist (otolaryngologist).  TREATMENT Treatment for recent onset of hearing loss may include:   Ear wax removal.   Being prescribed medicines to prevent infection (antibiotics).   Being prescribed medicines to reduce inflammation  (corticosteroids).  HOME CARE INSTRUCTIONS  If you were prescribed an antibiotic medicine, take it as told by your health care provider. Do not stop taking the antibiotic even if you start to feel better.  Take over-the-counter and prescription medicines only as told by your health care provider.  Avoid loud noises.   Return to your normal activities as told by your health care provider. Ask your health care provider what activities are safe for you.  Keep all follow-up visits as told by your health care provider. This is important. SEEK MEDICAL CARE IF:   You feel dizzy.   You develop new symptoms.   You vomit or feel nauseous.   You have a fever.  SEEK IMMEDIATE MEDICAL CARE IF:  You develop sudden changes in your vision.   You have severe ear pain.   You have new or increased weakness.  You have a severe headache.   This information is not intended to replace advice given to you by your health care provider. Make sure you discuss any questions you have with your health care provider.   Document Released: 02/18/2005 Document Revised: 11/09/2014 Document Reviewed: 07/06/2014 Elsevier Interactive Patient Education 2016 [employer]. Screening for Type 2 Diabetes Screening is a way to check for type 2 diabetes in people who do not have symptoms of the disease, but who may likely develop diabetes in the future. Diabetes can lead to serious health problems, but finding diabetes early allows for early treatment. DIABETES RISK FACTORS   Family history of  diabetes.  Diseases of the pancreas.  Obesity or being overweight.  Certain racial or ethnic groups:  American [place].  Pacific Islander.  Hispanic.  Asian.  African American.  High blood pressure (hypertension).  History of diabetes while pregnant (gestational diabetes).  Delivering a baby that weighed over 9 pounds.  Being inactive.  High cholesterol or triglycerides.  Age, especially over 2145  years of age.  Other diseases or conditions.  Diseases of the pancreas.  Cardiovascular disease.  Disorders of the endocrine system.  Certain medicines, such as those that treat high blood cholesterol levels. WHO IS SCREENED Adults  Adults who have no risk factors and no symptoms should be screened starting at age 45. If the screening tests are normal, they should be repeated every 3 years.  Adults who do not have symptoms, but have 1 or more risk factors, should be screened.  Adults who have 2 or more risk factors may be screened every year.  Adults who have an A1c (3 month average of blood glucose) greater than 5.7% or who had an impaired glucose tolerance (IGT) or impaired fasting glucose (IFG) on a previous test should be screened.  Pregnant women who have risk factors should be screened at their first prenatal visit.  Women who have given birth and had gestational diabetes should be screened 6-12 weeks after the child is born. This screening should be repeated every 1-3 years after the first test. Children or Adolescents  Children and adolescents should be screened for type 2 diabetes if they are overweight and have 2 of the following risk factors:  Having a family history of type 2 diabetes.  Being a member of a high risk race or ethnic group.  Having signs of insulin resistance or conditions associated with insulin resistance.  Having a mother who had gestational diabetes while pregnant with him or her.  Screening should start at age 45 or at the onset of puberty, whichever comes first. This should be repeated every 2 years. SCREENING In a screening, your caregiver may:  Ask questions about your overall health. This will include questions about the health of close family members, too.  Ask about any diabetes-like symptoms you may have.  Perform a physical exam.  Order some tests that may include:  A fasting plasma glucose test. This measures the level of  glucose in your blood. It is done after you have had nothing to eat but water (fasted) for 8 hours.  A random blood glucose test. This test is done without the need to fast.  An oral glucose tolerance test. This is a blood test done in 2 parts. First, a blood sample is taken after you have fasted. Then, another sample is taken after you drink a liquid that contains a lot of sugar.  An A1c test. This test shows how much glucose has been in your blood over the past 2 to 3 months.   This information is not intended to replace advice given to you by your health care provider. Make sure you discuss any questions you have with your health care provider.   Document Released: 12/15/2008 Document Revised: 03/11/2014 Document Reviewed: 09/26/2010 Elsevier Interactive Patient Education [employer]2016 Elsevier Inc.

## 2015-05-09 NOTE — Progress Notes (Signed)
Patient is here to establish care.  Patient complains of not being able to hear out of her left ear a month ago. Patient took an antibiotic which gave no relief.  Patient denies any ringing in hear or pain.

## 2015-05-10 ENCOUNTER — Telehealth: Payer: Self-pay

## 2015-05-10 ENCOUNTER — Encounter: Payer: Self-pay | Admitting: Family Medicine

## 2015-05-10 LAB — COMPLETE METABOLIC PANEL WITH GFR
ALT: 20 U/L (ref 6–29)
AST: 17 U/L (ref 10–30)
Albumin: 4.5 g/dL (ref 3.6–5.1)
Alkaline Phosphatase: 44 U/L (ref 33–115)
BUN: 14 mg/dL (ref 7–25)
CHLORIDE: 101 mmol/L (ref 98–110)
CO2: 23 mmol/L (ref 20–31)
CREATININE: 0.7 mg/dL (ref 0.50–1.10)
Calcium: 9.6 mg/dL (ref 8.6–10.2)
GFR, Est African American: >89 mL/min (ref >=60)
GFR, Est Non African American: >89 mL/min (ref >=60)
GLUCOSE: 78 mg/dL (ref 65–99)
POTASSIUM: 4.1 mmol/L (ref 3.5–5.3)
SODIUM: 135 mmol/L (ref 135–146)
Total Bilirubin: 0.3 mg/dL (ref 0.2–1.2)
Total Protein: 7.6 g/dL (ref 6.1–8.1)

## 2015-05-10 LAB — HEMOGLOBIN A1C
Hgb A1c MFr Bld: 5.3 % (ref <5.7)
MEAN PLASMA GLUCOSE: 105 mg/dL (ref <117)

## 2015-05-10 LAB — HIV ANTIBODY (ROUTINE TESTING W REFLEX): HIV 1&2 Ab, 4th Generation: NONREACTIVE

## 2015-05-10 NOTE — Telephone Encounter (Signed)
Called and left message for patient to return call regarding labs. Thanks!  

## 2015-05-10 NOTE — Telephone Encounter (Signed)
-----   Message from Massie MaroonLachina M Hollis, [place] sent at 05/10/2015  6:46 AM EST ----- Regarding: lab results Please inform patient that all labs are within a normal range. I will send referral to ENT for sudden hearing loss and to dentist. The patient has orange card, please reiterate the orange card referral process.   Thanks ----- Message -----    From: Lab in [facility] Interface    Sent: 05/10/2015   3:25 AM      To: Massie MaroonLachina M Hollis, FNP

## 2015-05-15 ENCOUNTER — Telehealth: Payer: Self-pay | Admitting: *Deleted

## 2015-05-15 NOTE — Telephone Encounter (Signed)
Pt called and stated she emailed a health certificate to Armeniahina email address . Pt was wondering if Armeniahina will fill  that form out for her. Pt states she will fax the form as well. Pt states you can call he if you need more information. Please advise provider. Thanks

## 2015-05-16 ENCOUNTER — Other Ambulatory Visit: Payer: No Typology Code available for payment source

## 2015-05-29 ENCOUNTER — Telehealth: Payer: Self-pay

## 2015-06-14 ENCOUNTER — Ambulatory Visit: Payer: No Typology Code available for payment source | Admitting: Family Medicine

## 2015-07-20 MED FILL — FLUTICASONE PROP 50 MCG SPR: 50 | 30 days supply | Qty: 16 | Fill #0

## 2016-01-24 NOTE — Telephone Encounter (Signed)
ERROR

## 2017-07-29 ENCOUNTER — Other Ambulatory Visit: Payer: Self-pay

## 2017-08-21 ENCOUNTER — Other Ambulatory Visit (HOSPITAL_COMMUNITY): Payer: Self-pay | Admitting: *Deleted

## 2017-08-21 DIAGNOSIS — N644 Mastodynia: Secondary | ICD-10-CM

## 2017-09-18 ENCOUNTER — Ambulatory Visit: Payer: No Typology Code available for payment source

## 2017-09-18 ENCOUNTER — Other Ambulatory Visit (HOSPITAL_COMMUNITY): Payer: Self-pay | Admitting: Obstetrics and Gynecology

## 2017-09-18 ENCOUNTER — Encounter (HOSPITAL_COMMUNITY): Payer: Self-pay

## 2017-09-18 ENCOUNTER — Ambulatory Visit (HOSPITAL_COMMUNITY)
Admission: RE | Admit: 2017-09-18 | Discharge: 2017-09-18 | Disposition: A | Discharge status: Home / Self Care | Payer: Self-pay | Source: Ambulatory Visit | Attending: Obstetrics and Gynecology | Admitting: Obstetrics and Gynecology

## 2017-09-18 ENCOUNTER — Ambulatory Visit
Admission: RE | Admit: 2017-09-18 | Discharge: 2017-09-18 | Disposition: A | Discharge status: Home / Self Care | Payer: PRIVATE HEALTH INSURANCE | Source: Ambulatory Visit | Attending: Obstetrics and Gynecology | Admitting: Obstetrics and Gynecology

## 2017-09-18 ENCOUNTER — Ambulatory Visit
Admission: RE | Admit: 2017-09-18 | Discharge: 2017-09-18 | Disposition: A | Discharge status: Home / Self Care | Payer: Self-pay | Source: Ambulatory Visit | Attending: Obstetrics and Gynecology | Admitting: Obstetrics and Gynecology

## 2017-09-18 VITALS — BP 112/66 | Ht 69.0 in

## 2017-09-18 DIAGNOSIS — N644 Mastodynia: Secondary | ICD-10-CM

## 2017-09-18 DIAGNOSIS — N898 Other specified noninflammatory disorders of vagina: Secondary | ICD-10-CM

## 2017-09-18 DIAGNOSIS — Z01419 Encounter for gynecological examination (general) (routine) without abnormal findings: Secondary | ICD-10-CM

## 2017-09-18 NOTE — Patient Instructions (Signed)
Explained breast self awareness with Yolanda Keller. Let patient know [facility] will cover Pap smears and HPV typing every 5 years unless has a history of abnormal Pap smears. Referred patient to the [facility] of [facility] [place] for a screening mammogram. Appointment scheduled for Thursday, September 18, 2017 at 1350. Let patient know will follow up with her within the next couple weeks with results of Pap and wet prep by phone. Informed patient that the Breast Center will follow-up with her within the next couple of weeks with results of mammogram by letter or phone. Discussed smoking cessation with patient. Referred to the [facility] and gave resources to free [facility] cessation classes at [facility]. Yolanda Keller verbalized understanding.  Beyonce Sawatzky, Kathaleen Maserhristine Poll, RN 1:22 PM

## 2017-09-18 NOTE — Progress Notes (Signed)
Complaints of left breast pain x 2.5-3 weeks that resolved on July 4th.   Pap Smear: Pap smear completed today. Last Pap smear was 4 years ago and normal per patient. Per patient has a history of two abnormal Pap smears that a colposcopy was completed for follow-up. Patient stated the abnormal Pap smears were when she was 4918-47 years old and in her 620's. Per patient has had at least three normal Pap smears since last colposcopy. No Pap smear results are in Epic.  Physical exam: Breasts Breasts symmetrical. No skin abnormalities bilateral breasts. No nipple retraction bilateral breasts. No nipple discharge bilateral breasts. No lymphadenopathy. No lumps palpated bilateral breasts. No complaints of pain or tenderness on exam. [facility]. Appointment scheduled for Thursday, September 18, 2017 at 1350.        Pelvic/Bimanual   Ext Genitalia No lesions, no swelling and no discharge observed on external genitalia.         Vagina Vagina pink and normal texture. No lesions and a white frothy discharge observed in vagina. Wet prep completed.        Cervix Cervix is present. Cervix pink and of normal texture. White frothy discharge observed on cervix.    Uterus Uterus is present and palpable. Uterus in normal position and normal size.        Adnexae Bilateral ovaries present and palpable. No tenderness on palpation.         Rectovaginal No rectal exam completed today since patient had no rectal complaints. No skin abnormalities observed on exam.    Smoking History: Patient is a current smoker. Discussed smoking cessation with patient. Referred to the [facility] and gave resources to free smoking cessation classes at [facility].   Patient Navigation: Patient education provided. Access to services provided for patient through BCCCP program.   Breast and Cervical Cancer Risk Assessment: Patient has a family history of a sister,  maternal aunt, and paternal grandfather having breast cancer. Patient has no known genetic mutations or history of radiation treatment to the chest before age 47. Per patient has a history of cervical dysplasia. Patient has no history of being immunocompromised or DES exposure in-utero.  Risk Assessment    Risk Scores      09/18/2017   Last edited by: Priscille HeidelbergBrannock, Chaunda Vandergriff P, RN   5-year risk: 1.5 %   Lifetime risk: 14.2 %

## 2017-09-19 ENCOUNTER — Encounter (HOSPITAL_COMMUNITY): Payer: Self-pay | Admitting: *Deleted

## 2017-09-19 LAB — CYTOLOGY - PAP
Bacterial vaginitis: POSITIVE — AB
CANDIDA VAGINITIS: NEGATIVE
Diagnosis: NEGATIVE
HPV: NOT DETECTED
Trichomonas: NEGATIVE

## 2017-09-22 ENCOUNTER — Telehealth (HOSPITAL_COMMUNITY): Payer: Self-pay | Admitting: *Deleted

## 2017-09-22 ENCOUNTER — Other Ambulatory Visit (HOSPITAL_COMMUNITY): Payer: Self-pay | Admitting: *Deleted

## 2017-09-22 MED ORDER — METRONIDAZOLE 500 MG PO TABS: 500.0000 mg | ORAL_TABLET | Freq: Two times a day (BID) | ORAL | 0 refills | Status: DC

## 2017-09-22 NOTE — Telephone Encounter (Signed)
Telephoned patient at home number and advised patient of negative pap smear results. HPV was negative. Next pap smear due in five years. Advised patient wet prep did show bacterial vaginosis and [facility] [address]. Patient to finish all medication and no alcohol while taking medication. Patient voiced understanding.

## 2017-10-06 ENCOUNTER — Other Ambulatory Visit (HOSPITAL_COMMUNITY): Payer: Self-pay | Admitting: *Deleted

## 2017-10-06 DIAGNOSIS — Z0000 Encounter for general adult medical examination without abnormal findings: Secondary | ICD-10-CM

## 2017-10-08 ENCOUNTER — Inpatient Hospital Stay: Payer: PRIVATE HEALTH INSURANCE

## 2017-10-08 ENCOUNTER — Inpatient Hospital Stay: Payer: Medicaid Other | Attending: Obstetrics and Gynecology | Admitting: *Deleted

## 2017-10-08 VITALS — BP 106/72 | Ht 68.0 in | Wt 162.9 lb

## 2017-10-08 DIAGNOSIS — Z0000 Encounter for general adult medical examination without abnormal findings: Secondary | ICD-10-CM

## 2017-10-08 LAB — LIPID PANEL
CHOL/HDL RATIO: 2.7 ratio
CHOLESTEROL: 216 mg/dL — AB (ref 0–200)
HDL: 81 mg/dL (ref >40)
LDL Cholesterol: 112 mg/dL — ABNORMAL HIGH (ref 0–99)
Triglycerides: 117 mg/dL (ref <150)
VLDL: 23 mg/dL (ref 0–40)

## 2017-10-08 LAB — HEMOGLOBIN A1C
Hgb A1c MFr Bld: 5.6 % (ref 4.8–5.6)
MEAN PLASMA GLUCOSE: 114.02 mg/dL

## 2017-10-08 NOTE — Addendum Note (Signed)
Addended by: Johnny Latu L on: 10/08/2017 10:55 AM   Modules accepted: Orders  

## 2017-10-08 NOTE — Addendum Note (Signed)
Addended by: Alvina FilbertHICKERSON, Kurk Corniel L on: 10/08/2017 10:55 AM   Modules accepted: Orders

## 2017-10-08 NOTE — Progress Notes (Signed)
[facility] initial screening  Clinical Measurement:  Height:  68in Weight: 162.9lb  Blood Pressure: 106/72  Blood Pressure #2: 100/68  Fasting Labs Drawn Today, will review with patient when they result.  Medical History:  Patient states that she has not been diagnosed with high cholesterol, high blood pressure, diabetes or heart disease.  Medications:  Patients states she is not taking any medications for high cholesterol, high blood pressure or diabetes.  She is not taking aspirin daily to prevent heart attack or stroke.    Blood pressure, self measurement:  Patients states she does not measure blood pressure at home.    Nutrition:  Patient states she eats 0 cups of fruit and 2 cups of vegetables in an average day.  Patient states she does not eat fish regularly, she eats more than half a serving of whole grains daily. She drinks less than 36 ounces of beverages with added sugar weekly.  She is currently watching her sodium intake.  She has  had 3 drinks containing alcohol in the last seven days.    Physical activity:  Patient states that she gets 180 minutes of moderate exercise in a week.  She gets 30 minutes of vigorous exercise per week.    Smoking status:  Patient states she  smokes and is not around any smokers.  Patient [facility] Quitsmart [facility].  Quality of life:  Patient states that she has had 0 bad physical days out of the last 30 days. In the last 2 weeks, she has had 0 days that she has felt down or depressed. She has had 0 days in the last 2 weeks that she has had little interest or pleasure in doing things.   Risk reduction and counseling:  Patient states she wants to lose weight and increase fruit and vegetable intake.  I encouraged her to continue with current exercise regimen and increase vegetable and fruit intake.  Navigation:  I will notify patient of lab results.  Patient is aware of 2 more health coaching sessions and a follow up.

## 2017-10-10 ENCOUNTER — Telehealth (HOSPITAL_COMMUNITY): Payer: Self-pay | Admitting: *Deleted

## 2017-10-10 NOTE — Telephone Encounter (Signed)
Health coaching 2  Labs-cholesterol 216, LDL 112, triglycerides 117, HDL 81, hemoglobin A1C 5.6, mean plasma glucose 114.02   Patient is aware and understands these lab results.  Goals- I encouraged patient to continue her exercise regimen that she just started.  I encouraged patient to eat more low-fat or fat free dairy options and to eat more lean meats, fish or skinless chicken or Mal[place]key. Patient states she wants to quit smoking and I encouraged her to register for a Quitsmart class.  Navigation:   Patient is aware of 1 more health coaching sessions and a follow up.  Time- 10 minutes

## 2018-01-31 ENCOUNTER — Telehealth: Payer: Self-pay

## 2018-02-02 ENCOUNTER — Telehealth: Payer: Self-pay

## 2018-02-04 ENCOUNTER — Telehealth: Payer: Self-pay

## 2019-03-03 ENCOUNTER — Other Ambulatory Visit: Payer: Self-pay

## 2019-03-03 DIAGNOSIS — Z20822 Contact with and (suspected) exposure to covid-19: Secondary | ICD-10-CM

## 2019-03-04 LAB — NOVEL CORONAVIRUS, NAA: SARS-CoV-2, NAA: DETECTED — AB

## 2019-03-08 ENCOUNTER — Telehealth: Payer: Self-pay | Admitting: Internal Medicine

## 2019-03-08 ENCOUNTER — Ambulatory Visit: Payer: Medicaid Other | Attending: Internal Medicine

## 2019-03-08 ENCOUNTER — Encounter: Payer: Self-pay | Admitting: Internal Medicine

## 2019-03-08 DIAGNOSIS — Z20822 Contact with and (suspected) exposure to covid-19: Secondary | ICD-10-CM

## 2019-03-08 NOTE — Telephone Encounter (Signed)
Patient called about Positive Covid test from date testing event. The patient has mild upper respiratory symptoms and no fever. Symptoms are mild at this time. Patient understands the needs to stay in isolation for a total of 10 days from onset of symptom or 14 days total from date of testing if no symptom. Patient knows the health department may be in touch. I answered all of patient's questions and all concerns addressed.  Jeanann Lewandowsky, MD

## 2019-03-09 LAB — NOVEL CORONAVIRUS, NAA: SARS-CoV-2, NAA: DETECTED — AB

## 2019-05-07 ENCOUNTER — Other Ambulatory Visit: Payer: Self-pay

## 2019-05-07 ENCOUNTER — Ambulatory Visit: Payer: Medicaid Other | Attending: Internal Medicine

## 2019-05-07 DIAGNOSIS — Z23 Encounter for immunization: Secondary | ICD-10-CM

## 2019-05-07 NOTE — Progress Notes (Signed)
   Covid-19 Vaccination Clinic  Name:  MISCHELL BRANFORD    MRN: [id number] DOB: 04/27/1970  05/07/2019  Ms. Birkhead was observed post Covid-19 immunization for 15 minutes without incident. She was provided with Vaccine Information Sheet and instruction to access the V-Safe system.   Ms. Amorin was instructed to call 911 with any severe reactions post vaccine: Marland Kitchen Difficulty breathing  . Swelling of face and throat  . A fast heartbeat  . A bad rash all over body  . Dizziness and weakness

## 2019-06-07 ENCOUNTER — Ambulatory Visit: Payer: Medicaid Other

## 2019-06-10 ENCOUNTER — Ambulatory Visit: Payer: Medicaid Other | Attending: Internal Medicine

## 2019-06-10 DIAGNOSIS — Z23 Encounter for immunization: Secondary | ICD-10-CM

## 2019-06-10 NOTE — Progress Notes (Signed)
   Covid-19 Vaccination Clinic  Name:  Yolanda Keller    MRN: [id number] DOB: March 05, 1970  06/10/2019  Yolanda Keller was observed post Covid-19 immunization for 15 minutes without incident. She was provided with Vaccine Information Sheet and instruction to access the V-Safe system.   Yolanda Keller was instructed to call 911 with any severe reactions post vaccine: Marland Kitchen Difficulty breathing  . Swelling of face and throat  . A fast heartbeat  . A bad rash all over body  . Dizziness and weakness   Immunizations Administered    Name Date Dose VIS Date Route   Pfizer COVID-19 Vaccine 06/10/2019 12:17 PM 0.3 mL 02/12/2019 Intramuscular   Manufacturer: [employer], [employer]   Lot: [id number]   NDC: [address]-4

## 2020-03-16 ENCOUNTER — Ambulatory Visit: Payer: Medicaid Other | Attending: Internal Medicine

## 2020-03-16 DIAGNOSIS — Z23 Encounter for immunization: Secondary | ICD-10-CM

## 2020-03-16 NOTE — Progress Notes (Signed)
   Covid-19 Vaccination Clinic  Name:  HALEIGH DESMITH    MRN: [id number] DOB: 03-01-1971  03/16/2020  Ms. Overbay was observed post Covid-19 immunization for 15 minutes without incident. She was provided with Vaccine Information Sheet and instruction to access the V-Safe system.   Ms. Dickison was instructed to call 911 with any severe reactions post vaccine: Marland Kitchen Difficulty breathing  . Swelling of face and throat  . A fast heartbeat  . A bad rash all over body  . Dizziness and weakness

## 2022-04-20 DIAGNOSIS — R0981 Nasal congestion: Secondary | ICD-10-CM | POA: Diagnosis not present

## 2022-04-22 ENCOUNTER — Telehealth: Payer: Self-pay

## 2022-04-22 NOTE — Telephone Encounter (Signed)
Mychart msg sent. AS, CMA

## 2022-05-01 ENCOUNTER — Ambulatory Visit (INDEPENDENT_AMBULATORY_CARE_PROVIDER_SITE_OTHER): Payer: Medicaid Other | Admitting: Student

## 2022-05-01 ENCOUNTER — Encounter: Payer: Self-pay | Admitting: Student

## 2022-05-01 VITALS — BP 122/78 | HR 85 | Temp 98.0°F | Ht 67.0 in | Wt 178.0 lb

## 2022-05-01 DIAGNOSIS — Z566 Other physical and mental strain related to work: Secondary | ICD-10-CM

## 2022-05-01 DIAGNOSIS — E785 Hyperlipidemia, unspecified: Secondary | ICD-10-CM | POA: Diagnosis not present

## 2022-05-01 DIAGNOSIS — R0981 Nasal congestion: Secondary | ICD-10-CM

## 2022-05-01 DIAGNOSIS — G44209 Tension-type headache, unspecified, not intractable: Secondary | ICD-10-CM

## 2022-05-01 DIAGNOSIS — Z87898 Personal history of other specified conditions: Secondary | ICD-10-CM

## 2022-05-01 DIAGNOSIS — N951 Menopausal and female climacteric states: Secondary | ICD-10-CM | POA: Diagnosis not present

## 2022-05-01 DIAGNOSIS — R7303 Prediabetes: Secondary | ICD-10-CM | POA: Diagnosis not present

## 2022-05-01 DIAGNOSIS — Z1231 Encounter for screening mammogram for malignant neoplasm of breast: Secondary | ICD-10-CM

## 2022-05-01 DIAGNOSIS — Z131 Encounter for screening for diabetes mellitus: Secondary | ICD-10-CM

## 2022-05-01 LAB — POCT GLYCOSYLATED HEMOGLOBIN (HGB A1C): Hemoglobin A1C: 5.9 % — AB (ref 4.0–5.6)

## 2022-05-01 LAB — GLUCOSE, CAPILLARY: Glucose-Capillary: 128 mg/dL — ABNORMAL HIGH (ref 70–99)

## 2022-05-01 NOTE — Patient Instructions (Addendum)
Thank you, Yolanda Keller for allowing [place] to provide your care today. Today we discussed.  Pre-diabetes Please continue to work on a healthy diet, low in carbs (white bread, pasta) and focus on vegetables and fruits. Following up with the cooking with a cardiologist will be great for this.   Menopausal symptoms The two medicines to look into are 1. Venlafaxine and 2. Estrogen/progestin combination medicines. Please ask your sister about her history of breast cancer  Health screening I have put in a referral for a mammogram and will have the office call you to schedule a pap smear. Please also think about colon cancer screening with the home test that you do yearly vs colonoscopy which can be every 10 years. Remember if the home screening is positive, it will be recommended you have a colonoscopy  Tension headache I believe your headaches are associated with the increase stress in your life. Please look up tension headache stretches and you can use the voltaren gel on your neck as well  Dizziness I believe that this is from your headaches as well as increased stress. If they worsen and do not improve, please return to our clinic.   I have ordered the following labs for you:   Lab Orders         Lipid Profile         Glucose, capillary         POC Hbg A1C       Referrals ordered today:   Referral Orders  No referral(s) requested today     I have ordered the following medication/changed the following medications:   Stop the following medications: There are no discontinued medications.   Start the following medications: No orders of the defined types were placed in this encounter.    Follow up:  1 - 2 month follow up     Should you have any questions or concerns please call the internal medicine clinic at [phone].    Sanjuana Letters, D.O. [place]

## 2022-05-02 ENCOUNTER — Encounter: Payer: Self-pay | Admitting: Student

## 2022-05-02 DIAGNOSIS — E785 Hyperlipidemia, unspecified: Secondary | ICD-10-CM | POA: Insufficient documentation

## 2022-05-02 DIAGNOSIS — N951 Menopausal and female climacteric states: Secondary | ICD-10-CM | POA: Insufficient documentation

## 2022-05-02 DIAGNOSIS — Z566 Other physical and mental strain related to work: Secondary | ICD-10-CM | POA: Insufficient documentation

## 2022-05-02 DIAGNOSIS — Z1239 Encounter for other screening for malignant neoplasm of breast: Secondary | ICD-10-CM | POA: Insufficient documentation

## 2022-05-02 DIAGNOSIS — G44209 Tension-type headache, unspecified, not intractable: Secondary | ICD-10-CM | POA: Insufficient documentation

## 2022-05-02 DIAGNOSIS — R0981 Nasal congestion: Secondary | ICD-10-CM | POA: Insufficient documentation

## 2022-05-02 DIAGNOSIS — R7303 Prediabetes: Secondary | ICD-10-CM | POA: Insufficient documentation

## 2022-05-02 LAB — LIPID PANEL
Chol/HDL Ratio: 3.6 ratio (ref 0.0–4.4)
Cholesterol, Total: 231 mg/dL — ABNORMAL HIGH (ref 100–199)
HDL: 65 mg/dL (ref >39)
LDL Chol Calc (NIH): 113 mg/dL — ABNORMAL HIGH (ref 0–99)
Triglycerides: 312 mg/dL — ABNORMAL HIGH (ref 0–149)
VLDL Cholesterol Cal: 53 mg/dL — ABNORMAL HIGH (ref 5–40)

## 2022-05-02 MED ORDER — DICLOFENAC SODIUM 1 % EX GEL: 2.0000 g | Freq: Four times a day (QID) | CUTANEOUS | 1 refills | Status: DC

## 2022-05-02 NOTE — Assessment & Plan Note (Signed)
Assessment: No recent episodes. Given [employer] exercises if occurs again and discussed medications we can use for acute episodes. She will reach out if any episodes.   Plan: -continue to monitor

## 2022-05-02 NOTE — Assessment & Plan Note (Signed)
Assessment: Family history of breast cancer in her sister and maternal grandfather. She is unsure of the genotype. Encouraged her to find out. She denies abnormal mammograms in the past.   Plan: - referral placed for mammogram

## 2022-05-02 NOTE — Assessment & Plan Note (Addendum)
Assessment: Hx of HLD. Today total cholesterol 231 and LDL of 113. ASCVD risk calculator 3% risk of event in next 10 years. With lifestyle modification she plans to make of exercising more and working on her diet, believe she will improve this with time. No medications needed as of now  Plan: - lifestyle modifications - repeat lipid panel annually

## 2022-05-02 NOTE — Assessment & Plan Note (Signed)
Assessment: Presented to urgent care on 2/17 for worsening sinus pain with congestion. She was diagnosed with URI vs allergies and prescribed steroids. She denies picking up the steroids and has had some improvement. On exam her left TM has a mild effusion and erythematous nasal turbinates. Suspect viral URI that she is recovering from.   Plan: - continue symptomatic managment  The pressure in her nose is horrible and she can barely breathe. Her symptoms started on Tuesday night and worsened on Wednesday. She does not take year-round allergy medications. She uses Flonase as needed. She took Sudafed. She was taking Mucinex because she thought she was getting a cold. Her pressure got really bad with Mucinex. She denies ear pain or pressure, sore throat, coughing, chest pain, nausea, vomiting, diarrhea, or constipation

## 2022-05-02 NOTE — Assessment & Plan Note (Signed)
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 She does not have a lot of time to focus on herself and her health. The past week she has developed frequent headaches that I believe are tension headaches. We talked about setting boundaries and taking time for herself. She has a great support system.   She is agreeable to meet with Wyatt Portela and discuss some of these topics further. I believe she would benefit from CBT.  Plan: - IBH referral placed

## 2022-05-02 NOTE — Assessment & Plan Note (Signed)
Assessment: A1c today of 5.9%. We discussed pre-diabetes and how with lifestyle modifications of increasing daily exercise and a healthy diet should prevent progression to diabetes. She notes recent increase in weight during the winter months when she exercises less. She is quite motivated and do not believe medications warranted at this time.   Plan: - follow up A1c in 3 months - encourage healthy lifestyle changes

## 2022-05-02 NOTE — Assessment & Plan Note (Addendum)
Assessment: Presents with recent bi-temporal headache that Yolanda Keller describes as a throbbing pain. Yolanda Keller notes it feels like a tight band across her forehead. It comes and goes, it has improved with lying down in a dark room. Denies vision changes, nausea, vomiting, or photophobia. No nystagmus present.   With her recent increase stressors believe this is a tension headache. We talked about looking online for stretches and using things like voltaren gel (can also use muscle rubs) to help with this. In the past Yolanda Keller has done float therapy and this helped ease her stress, encouraged her to look into this. Will treat symptomatically while we work on her underlying stress.  Plan: - votaren gel, neck stretches - continue to work on increase stress

## 2022-05-02 NOTE — Progress Notes (Signed)
CC: Establish care  HPI:  Yolanda Keller is a 52 y.o. female living with a history stated below and presents today to establish care with our clinic. Please see problem based assessment and plan for additional details.  Past Medical History:  Diagnosis Date   Endometriosis    Hearing loss of both ears    Menopause    Seasonal depression ([place])    Vertigo     Current Outpatient Medications on File Prior to Visit  Medication Sig Dispense Refill   fluticasone (FLONASE) 50 MCG/ACT nasal spray Place into both nostrils daily.     metroNIDAZOLE (FLAGYL) 500 MG tablet Take 1 tablet (500 mg total) by mouth 2 (two) times daily. 14 tablet 0   No current facility-administered medications on file prior to visit.    Family History  Problem Relation Age of Onset   Hypertension Mother    Cancer Father        prostate and bladder   Breast cancer Sister 59   Breast cancer Maternal Aunt    Breast cancer Maternal Grandfather     Social History   Socioeconomic History   Marital status: Single    Spouse name: Not on file   Number of children: Not on file   Years of education: Not on file   Highest education level: Not on file  Occupational History   Not on file  Tobacco Use   Smoking status: Some Days    Types: Cigars   Smokeless tobacco: Never  Vaping Use   Vaping Use: Never used  Substance and Sexual Activity   Alcohol use: Yes    Alcohol/week: 3.0 standard drinks of alcohol    Types: 3 Glasses of wine per week    Comment: social   Drug use: No   Sexual activity: Not Currently    Partners: Male    Birth control/protection: None  Other Topics Concern   Not on file  Social History Narrative   Not on file   Social Determinants of Health   Financial Resource Strain: Not on file  Food Insecurity: Not on file  Transportation Needs: Not on file  Physical Activity: Not on file  Stress: Not on file  Social Connections: Not on file  Intimate Partner Violence: Not  on file    Review of Systems: ROS negative except for what is noted on the assessment and plan.  Vitals:   05/01/22 1627  BP: 122/78  Pulse: 85  Temp: 98 F (36.7 C)  TempSrc: Oral  SpO2: 99%  Weight: 178 lb (80.7 kg)  Height: '5\' 7"'$  (1.702 m)    Physical Exam: Constitutional: in no acute distress HENT: normocephalic atraumatic, mucous membranes moist. Nasal turbinates erythematous Eyes: conjunctiva non-erythematous. No nystagmus Neck: supple Cardiovascular: regular rate and rhythm, no m/r/g Pulmonary/Chest: normal work of breathing on room air, lungs clear to auscultation bilaterally MSK: normal bulk and tone. Taut trapezius muscles  Neurological: alert & oriented x 3 Skin: warm and dry Psych: normal mood  Assessment & Plan:   Prediabetes Assessment: A1c today of 5.9%. We discussed pre-diabetes and how with lifestyle modifications of increasing daily exercise and a healthy diet should prevent progression to diabetes. She notes recent increase in weight during the winter months when she exercises less. She is quite motivated and do not believe medications warranted at this time.   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 She does not have a lot of time to focus on herself and her health. The past week she has developed frequent headaches that I believe are tension headaches. We talked about setting boundaries and taking time for herself. She has a great support system.   She is agreeable to meet with Wyatt Portela and discuss some of these topics further. I believe she would benefit from CBT.  Plan: - IBH referral placed  Tension headache Assessment: Presents with recent bi-temporal headache that she describes as a throbbing pain. She notes it feels like a tight band across her forehead. It comes and goes, it has improved with  lying down in a dark room. Denies vision changes, nausea, vomiting, or photophobia. No nystagmus present.   With her recent increase stressors believe this is a tension headache. We talked about looking online for stretches and using things like voltaren gel (can also use muscle rubs) to help with this. In the past she has done float therapy and this helped ease her stress, encouraged her to look into this. Will treat symptomatically while we work on her underlying stress.  Plan: - votaren gel, neck stretches - continue to work on increase stress   Breast cancer screening Assessment: Family history of breast cancer in her sister and maternal grandfather. She is unsure of the genotype. Encouraged her to find out. She denies abnormal mammograms in the past.   Plan: - referral placed for mammogram   Vasomotor symptoms due to menopause Assessment: Endorses hot flashes throughout the day for many years. She does not remember the date of her LNMP. She is interested in therapies. We discussed venalfaxine and hormone replacement therapy. She has no personal history of malignancy or blood clots. She has a partial oopherectomy. Also encouraged her to discuss [facility] family history and genotyping. She would like to read about the therapies and discuss at a follow up appointment.   Plan: - consider venalfaxine (also help with increase stressors/anxiety) vs hormone replacement therapy   History of vertigo Assessment: No recent episodes. Given [employer] exercises if occurs again and discussed medications we can use for acute episodes. She will reach out if any episodes.   Plan: -continue to monitor   Sinus congestion Assessment: Presented to urgent care on 2/17 for worsening sinus pain with congestion. She was diagnosed with URI vs allergies and prescribed steroids. She denies picking up the steroids and has had some improvement. On exam her left TM has a mild effusion and erythematous nasal  turbinates. Suspect viral URI that she is recovering from.   Plan: - continue symptomatic managment  The pressure in her nose is horrible and she can barely breathe. Her symptoms started on Tuesday night and worsened on Wednesday. She does not take year-round allergy medications. She uses Flonase as needed. She took Sudafed. She was taking Mucinex because she thought she was getting a cold. Her pressure got really bad with Mucinex. She denies ear pain or pressure, sore throat, coughing, chest pain, nausea, vomiting, diarrhea, or constipation   Hyperlipidemia Assessment: Hx of HLD. Today total cholesterol 231 and LDL of 113. ASCVD risk calculator 3% risk of event in next 10 years. With lifestyle modification she plans to make of exercising more and working on her diet, believe she will improve this with time. No medications needed as of now  Plan: - lifestyle modifications - repeat lipid panel annually  Patient discussed with Dr. Nicky Pugh  Darius Fillingim, D.O. [facility], PGY-3 Phone: [phone] Date 05/02/2022 Time 2:44 PM

## 2022-05-02 NOTE — Assessment & Plan Note (Signed)
Assessment: Endorses hot flashes throughout the day for many years. She does not remember the date of her LNMP. She is interested in therapies. We discussed venalfaxine and hormone replacement therapy. She has no personal history of malignancy or blood clots. She has a partial oopherectomy. Also encouraged her to discuss [facility] family history and genotyping. She would like to read about the therapies and discuss at a follow up appointment.   Plan: - consider venalfaxine (also help with increase stressors/anxiety) vs hormone replacement therapy

## 2022-05-07 NOTE — Addendum Note (Signed)
Addended by: Riesa Pope on: 05/07/2022 01:19 PM   Modules accepted: Orders

## 2022-05-09 ENCOUNTER — Other Ambulatory Visit: Payer: Self-pay | Admitting: Student

## 2022-05-09 ENCOUNTER — Ambulatory Visit
Admission: RE | Admit: 2022-05-09 | Discharge: 2022-05-09 | Disposition: A | Discharge status: Home / Self Care | Payer: Medicaid Other | Source: Ambulatory Visit | Attending: Student in an Organized Health Care Education/Training Program | Admitting: Student in an Organized Health Care Education/Training Program

## 2022-05-09 DIAGNOSIS — R7303 Prediabetes: Secondary | ICD-10-CM

## 2022-05-09 DIAGNOSIS — N951 Menopausal and female climacteric states: Secondary | ICD-10-CM

## 2022-05-09 DIAGNOSIS — Z87898 Personal history of other specified conditions: Secondary | ICD-10-CM

## 2022-05-09 DIAGNOSIS — G44209 Tension-type headache, unspecified, not intractable: Secondary | ICD-10-CM

## 2022-05-09 DIAGNOSIS — R0981 Nasal congestion: Secondary | ICD-10-CM

## 2022-05-09 DIAGNOSIS — Z566 Other physical and mental strain related to work: Secondary | ICD-10-CM

## 2022-05-09 DIAGNOSIS — Z1231 Encounter for screening mammogram for malignant neoplasm of breast: Secondary | ICD-10-CM

## 2022-05-09 DIAGNOSIS — E785 Hyperlipidemia, unspecified: Secondary | ICD-10-CM

## 2022-05-09 DIAGNOSIS — Z131 Encounter for screening for diabetes mellitus: Secondary | ICD-10-CM

## 2022-05-09 NOTE — Progress Notes (Signed)
Internal Medicine Clinic Attending  Case discussed with Dr. Katsadouros  At the time of the visit.  We reviewed the resident's history and exam and pertinent patient test results.  I agree with the assessment, diagnosis, and plan of care documented in the resident's note.  

## 2022-05-13 ENCOUNTER — Ambulatory Visit (INDEPENDENT_AMBULATORY_CARE_PROVIDER_SITE_OTHER): Payer: Medicaid Other

## 2022-05-13 ENCOUNTER — Ambulatory Visit: Payer: Medicaid Other | Admitting: Podiatry

## 2022-05-13 DIAGNOSIS — M79671 Pain in right foot: Secondary | ICD-10-CM | POA: Diagnosis not present

## 2022-05-13 DIAGNOSIS — M79672 Pain in left foot: Secondary | ICD-10-CM

## 2022-05-13 DIAGNOSIS — M722 Plantar fascial fibromatosis: Secondary | ICD-10-CM | POA: Diagnosis not present

## 2022-05-13 MED ORDER — METHYLPREDNISOLONE 4 MG PO TBPK: ORAL_TABLET | ORAL | 0 refills | Status: DC

## 2022-05-13 MED ORDER — MELOXICAM 15 MG PO TABS: 15.0000 mg | ORAL_TABLET | Freq: Every day | ORAL | 1 refills | Status: DC

## 2022-05-13 NOTE — Progress Notes (Signed)
   Chief Complaint  Patient presents with   Plantar Fasciitis    Patient came in today for arch and heel pain, started 4 months ago, rate of pain 7 out of 10, patient is having pain in the morning, X-rays done today     Subjective: 52 y.o. female presenting today for evaluation of bilateral heel pain that has been ongoing for about 2-3 months now.  Gradual onset.  Denies a history of injury.  She has not done anything for treatment.   Past Medical History:  Diagnosis Date   Endometriosis    Hearing loss of both ears    Menopause    Seasonal depression ([place])    Vertigo      Objective: Physical Exam General: The patient is alert and oriented x3 in no acute distress.  Dermatology: Skin is warm, dry and supple bilateral lower extremities. Negative for open lesions or macerations bilateral.   Vascular: Dorsalis Pedis and Posterior Tibial pulses palpable bilateral.  Capillary fill time is immediate to all digits.  Neurological: Epicritic and protective threshold intact bilateral.   Musculoskeletal: Tenderness to palpation to the plantar aspect of the bilateral heels along the plantar fascia. All other joints range of motion within normal limits bilateral. Strength 5/5 in all groups bilateral.   Radiographic exam B/L feet 05/13/2022: Normal osseous mineralization. Joint spaces preserved. No fracture/dislocation/boney destruction. No other soft tissue abnormalities or radiopaque foreign bodies.   Assessment: 1. plantar fasciitis bilateral feet  Plan of Care:  1. Patient evaluated. Xrays reviewed.   2.  Patient was very fearful/anxious over injections.  Declined cortisone injections today 3. Rx for Medrol Dose Pak placed 4. Rx for Meloxicam ordered for patient. 5. Plantar fascial band(s) dispensed for bilateral plantar fasciitis. 6. Instructed patient regarding therapies and modalities at home to alleviate symptoms.  7. Return to clinic in 4 weeks.    Edrick Kins, DPM [facility]  Dr. Edrick Kins, DPM    [address], [place] [address]                Office [phone]  Fax [phone]

## 2022-05-13 NOTE — Patient Instructions (Signed)

## 2022-05-14 ENCOUNTER — Encounter: Payer: Self-pay | Admitting: Student

## 2022-05-16 ENCOUNTER — Other Ambulatory Visit: Payer: Self-pay | Admitting: Student

## 2022-05-16 DIAGNOSIS — Z1211 Encounter for screening for malignant neoplasm of colon: Secondary | ICD-10-CM

## 2022-05-17 ENCOUNTER — Encounter: Payer: Medicaid Other | Admitting: Radiology

## 2022-05-21 ENCOUNTER — Ambulatory Visit: Payer: Self-pay | Admitting: Licensed Clinical Social Worker

## 2022-05-21 DIAGNOSIS — Z566 Other physical and mental strain related to work: Secondary | ICD-10-CM

## 2022-05-21 NOTE — BH Specialist Note (Signed)
Integrated Behavioral Health via Telemedicine Visit  05/21/2022 Yolanda Keller [id number]  Number of Integrated Behavioral Health Clinician visits: 1- Initial Visit  Session Start time: 1400   Session End time: 1430  Total time in minutes: 30  Referring Provider: Belva Agee, MD Patient/Family location: At work, in vehicle while parked [facility] Provider location: Office All persons participating in visit: [facility] and Patient Types of Service: Video visit and Health & Behavioral Assessment/Intervention   I connected with Yolanda Keller  via  Telephone or Engineer, civil (consulting)  (Video is Surveyor, mining) and verified that I am speaking with the correct person using two identifiers. Discussed confidentiality: Yes    I discussed the limitations of telemedicine and the availability of in person appointments.  Discussed there is a possibility of technology failure and discussed alternative modes of communication if that failure occurs.   I discussed that engaging in this telemedicine visit, they consent to the provision of behavioral healthcare and the services will be billed under their insurance.   Patient and/or legal guardian expressed understanding and consented to Telemedicine visit: Yes    Presenting Concerns: Patient and/or family reports the following symptoms/concerns: Grief Counseling  Duration of problem: 3 years and ongoing; Severity of problem: moderate   Patient and/or Family's Strengths/Protective Factors: Sense of purpose   Goals Addressed: Patient will:  Reduce symptoms of: stress and Grief       Progress towards Goals: Ongoing   Interventions: Interventions utilized:  Motivational Interviewing, Mindfulness or Relaxation Training, and Supportive Counseling Standardized Assessments completed: PHQ-SADS       05/01/2022    5:34 PM  PHQ-SADS Last 3 Score only  Total GAD-7 Score 6  PHQ Adolescent Score 5         Assessment: Patient scheduled to leave for trip. Patient experiencing anxiety with task(s) she would like to complete. [facility] recommended patient to document task and mark them off one by one.  Patient  having a mild conflict with friend, which is highly impacting her job. Patient will practice having a gentle approach when communicating to friend.   Patient has agreed to write a letter to individuals in her life who have cause her trauma. Patient will either hand the letters to the individuals or destroy them in [place].   Patient may benefit from ongoing counseling.   Plan: Follow up with behavioral health clinician on : within 30 days     I discussed the assessment and treatment plan with the patient and/or parent/guardian. They were provided an opportunity to ask questions and all were answered. They agreed with the plan and demonstrated an understanding of the instructions.   They were advised to call back or seek an in-person evaluation if the symptoms worsen or if the condition fails to improve as anticipated.   Christen Butter, MSW, LCSW-A She/Her Behavioral Health Clinician [facility]  Internal Medicine Center Direct [phone]  Fax [phone] Main Office Phone: [phone] [address] [place] [place], [place] [address] Website: [facility] Internal Medicine [facility]  [place], [place]  [place]

## 2022-05-23 ENCOUNTER — Encounter: Payer: Self-pay | Admitting: Nurse Practitioner

## 2022-05-23 ENCOUNTER — Encounter: Payer: Medicaid Other | Admitting: Nurse Practitioner

## 2022-05-23 ENCOUNTER — Ambulatory Visit: Payer: Medicaid Other | Admitting: Nurse Practitioner

## 2022-05-23 ENCOUNTER — Other Ambulatory Visit (HOSPITAL_COMMUNITY)
Admission: RE | Admit: 2022-05-23 | Discharge: 2022-05-23 | Disposition: A | Discharge status: Home / Self Care | Payer: Medicaid Other | Source: Ambulatory Visit | Attending: Nurse Practitioner | Admitting: Nurse Practitioner

## 2022-05-23 VITALS — BP 92/62 | HR 65 | Resp 16 | Ht 67.5 in | Wt 171.0 lb

## 2022-05-23 DIAGNOSIS — N951 Menopausal and female climacteric states: Secondary | ICD-10-CM | POA: Diagnosis not present

## 2022-05-23 DIAGNOSIS — Z124 Encounter for screening for malignant neoplasm of cervix: Secondary | ICD-10-CM

## 2022-05-23 DIAGNOSIS — Z01419 Encounter for gynecological examination (general) (routine) without abnormal findings: Secondary | ICD-10-CM | POA: Diagnosis not present

## 2022-05-23 NOTE — Progress Notes (Signed)
Yolanda Keller 1970-10-16 [id number]   History:  52 y.o. G1P0010 presents as new patient to establish care. Postmenopausal since 2019. Complains of hot flashes and sleep disturbance for a few years. Not interested in medications or HRT at this time but would like some natural remedies. H/O LEEP in late teens/early 20s. Half sister (age 58, ER+, has not had genetic testing but plans to), maternal aunt and MGF with history of breast cancer. S/P LS for endometriosis. Pre-diabetes, HLD managed by PCP. Behavioral health referral done by PCP to help patient cope with personal life stressors.   Gynecologic History Patient's last menstrual period was 09/01/2017 (approximate).   Contraception/Family planning: post menopausal status Sexually active: No  Health Maintenance Last Pap: 09/18/2017. Results were: Normal neg HPV Last mammogram: 05/09/2022. Results were: Normal Last colonoscopy: Never. Consult scheduled 07/2022 Last Dexa: Not indicated  Past medical history, past surgical history, family history and social history were all reviewed and documented in the EPIC chart. Single.   ROS:  A ROS was performed and pertinent positives and negatives are included.  Exam:  Vitals:   05/23/22 0957  BP: 92/62  Pulse: 65  Resp: 16  Weight: 171 lb (77.6 kg)  Height: 5' 7.5" (1.715 m)   Body mass index is 26.39 kg/m.  General appearance:  Normal Thyroid:  Symmetrical, normal in size, without palpable masses or nodularity. Respiratory  Auscultation:  Clear without wheezing or rhonchi Cardiovascular  Auscultation:  Regular rate, without rubs, murmurs or gallops  Edema/varicosities:  Not grossly evident Abdominal  Soft,nontender, without masses, guarding or rebound.  Liver/spleen:  No organomegaly noted  Hernia:  None appreciated  Skin  Inspection:  Grossly normal Breasts: Examined lying and sitting.   Right: Without masses, retractions, nipple discharge or axillary  adenopathy.   Left: Without masses, retractions, nipple discharge or axillary adenopathy. Genitourinary   Inguinal/mons:  Normal without inguinal adenopathy  External genitalia:  Normal appearing vulva with no masses, tenderness, or lesions  BUS/Urethra/Skene's glands:  Normal  Vagina:  Normal appearing with normal color and discharge, no lesions  Cervix:  Normal appearing without discharge or lesions  Uterus:  Normal in size, shape and contour.  Midline and mobile, nontender  Adnexa/parametria:     Rt: Normal in size, without masses or tenderness.   Lt: Normal in size, without masses or tenderness.  Anus and perineum: Normal  Digital rectal exam: Deferred  Patient informed chaperone available to be present for breast and pelvic exam. Patient has requested no chaperone to be present. Patient has been advised what will be completed during breast and pelvic exam.   Assessment/Plan:  52 y.o. G1P0010 to establish care.   Well female exam with routine gynecological exam - Education provided on SBEs, importance of preventative screenings, current guidelines, high calcium diet, regular exercise, and multivitamin daily.  Labs with PCP.   Screening for cervical cancer - Plan: Cytology - PAP( [facility]). LEEP in late teens/early 20s. Normal paps since.   Menopausal symptoms - Management options reviewed to include OTC supplements, lifestyle changes, SSRI such as Effexor, Veozah, and HRT. Provided her with a list of OTC supplements, recommend magnesium at night for sleep.   Screening for breast cancer - Normal mammogram history.  Continue annual screenings.  Normal breast exam today.  Screening for colon cancer - Has not had screening colonoscopy. Scheduled for consult in May.   Screening for osteoporosis - Average risk. Will plan DXA at age 24.   Return in 1  year for annual.     Tamela Gammon DNP, 11:03 AM 05/23/2022

## 2022-05-27 LAB — CYTOLOGY - PAP
Comment: NEGATIVE
Diagnosis: UNDETERMINED — AB
High risk HPV: POSITIVE — AB

## 2022-05-28 ENCOUNTER — Other Ambulatory Visit: Payer: Self-pay | Admitting: Nurse Practitioner

## 2022-05-28 ENCOUNTER — Encounter: Payer: Self-pay | Admitting: Nurse Practitioner

## 2022-05-28 DIAGNOSIS — B3731 Acute candidiasis of vulva and vagina: Secondary | ICD-10-CM

## 2022-05-28 MED ORDER — FLUCONAZOLE 150 MG PO TABS: 150.0000 mg | ORAL_TABLET | ORAL | 0 refills | Status: DC

## 2022-05-28 NOTE — Telephone Encounter (Signed)
Forwarding to provider for final review and closing encounter.

## 2022-05-30 ENCOUNTER — Ambulatory Visit (INDEPENDENT_AMBULATORY_CARE_PROVIDER_SITE_OTHER): Payer: Medicaid Other | Admitting: Student

## 2022-05-30 VITALS — BP 104/73 | HR 82 | Temp 97.9°F | Ht 69.0 in | Wt 173.5 lb

## 2022-05-30 DIAGNOSIS — R7303 Prediabetes: Secondary | ICD-10-CM | POA: Diagnosis not present

## 2022-05-30 DIAGNOSIS — N951 Menopausal and female climacteric states: Secondary | ICD-10-CM | POA: Diagnosis not present

## 2022-05-30 DIAGNOSIS — Z1239 Encounter for other screening for malignant neoplasm of breast: Secondary | ICD-10-CM

## 2022-05-30 NOTE — Patient Instructions (Addendum)
Thank you, Ms.Anna L Iezzi for allowing [place] to provide your care today. Today we discussed .  You are doing a great job focusing on a healthy diet and exercise. I will refer you to Butch Penny in our clinic to discuss diet and nutrition.     Please continue to work on your menopausal symptoms and treatments.   Referrals ordered today:   Referral Orders         Referral to Nutrition and Diabetes Services       Follow up: 3-4 months    Should you have any questions or concerns please call the internal medicine clinic at [phone].    Sanjuana Letters, D.O. [place]

## 2022-05-30 NOTE — Assessment & Plan Note (Addendum)
Visit today focused on discussing healthy diet low in carbohydrates and focusing on lean meats. We discussed multigrain breads and increasing amount of fruits and vegetables. She is working on exercising regularly. She is quite motivated and would like further information about a healthy diet, though from our conversations she is doing well. Will follow up A1c in 6 months. She would like referral to Butch Penny, diabetes coordinator and RD in our clinic.

## 2022-05-30 NOTE — Assessment & Plan Note (Signed)
Started on estropause. Will continue treatment

## 2022-05-30 NOTE — Assessment & Plan Note (Signed)
Yolanda Keller has followed up with her sister, noting her history of estrogen receptor positive breast cancer. She is unsure of BRCA status, but notes her sister will be tested soon. Ms. Drymon met with her OBGYN and discussed treatment of vasomotor symptoms from menopause with this in mind.

## 2022-05-30 NOTE — Progress Notes (Signed)
   CC: follow up pre-diabetes  HPI:  [name] is a 52 y.o. female living with a history stated below and presents today for follow up from our prior visit. Please see problem based assessment and plan for additional details.  Past Medical History:  Diagnosis Date   Endometriosis    Hearing loss of both ears    Menopause    Seasonal depression (HCC)    Vertigo     Current Outpatient Medications on File Prior to Visit  Medication Sig Dispense Refill   fluconazole (DIFLUCAN) 150 MG tablet Take 1 tablet (150 mg total) by mouth every 3 (three) days. 2 tablet 0   fluticasone (FLONASE) 50 MCG/ACT nasal spray Place into both nostrils daily.     No current facility-administered medications on file prior to visit.    Review of Systems: ROS negative except for what is noted on the assessment and plan.  Vitals:   05/30/22 1323  BP: 104/73  Pulse: 82  Temp: 97.9 F (36.6 C)  TempSrc: Oral  SpO2: 99%  Weight: 173 lb 8 oz (78.7 kg)  Height: 5\' 9"  (1.753 m)    Physical Exam: Constitutional: well-appearing, in no acute distress HENT: normocephalic atraumatic Eyes: conjunctiva non-erythematous Neck: supple Cardiovascular: regular rate and rhythm, no m/r/g Pulmonary/Chest: normal work of breathing on room air, lungs clear to auscultation bilaterally MSK: normal bulk and tone Neurological: alert & oriented x 3 Psych: normal mood  Assessment & Plan:   Breast cancer screening Ms. Headden has followed up with her sister, noting her history of estrogen receptor positive breast cancer. She is unsure of BRCA status, but notes her sister will be tested soon. Ms. Stipes met with her OBGYN and discussed treatment of vasomotor symptoms from menopause with this in mind.   Vasomotor symptoms due to menopause Started on estropause. Will continue treatment  Prediabetes Visit today focused on discussing healthy diet low in carbohydrates and focusing on lean meats. We  discussed multigrain breads and increasing amount of fruits and vegetables. She is working on exercising regularly. She is quite motivated and would like further information about a healthy diet, though from our conversations she is doing well. Will follow up A1c in 6 months. She would like referral to Butch Penny, diabetes coordinator and RD in our clinic.   Patient discussed with Dr. Fanny Bien, D.O. [facility], PGY-3 Phone: [phone] Date 05/30/2022 Time 10:45 PM

## 2022-06-05 ENCOUNTER — Ambulatory Visit: Payer: Medicaid Other | Admitting: Licensed Clinical Social Worker

## 2022-06-05 DIAGNOSIS — Z7189 Other specified counseling: Secondary | ICD-10-CM

## 2022-06-05 NOTE — BH Specialist Note (Signed)
Integrated Behavioral Health via Telemedicine Visit  06/05/2022 Yolanda Keller [id number]  Number of Integrated Behavioral Health Clinician visits: 3- Third Visit  Session Start time: 1500   Session End time: [id number]  Total time in minutes: 34   Referring Provider: Riesa Pope, MD Patient/Family location: At work, in vehicle while parked [facility] Provider location: Office All persons participating in visit: [facility] and Patient Types of Service: Video visit and Jaconita Assessment/Intervention  I connected with Yolanda Keller  via  Telephone or Geologist, engineering  (Video is Tree surgeon) and verified that I am speaking with the correct person using two identifiers. Discussed confidentiality: Yes   I discussed the limitations of telemedicine and the availability of in person appointments.  Discussed there is a possibility of technology failure and discussed alternative modes of communication if that failure occurs.  I discussed that engaging in this telemedicine visit, they consent to the provision of behavioral healthcare and the services will be billed under their insurance.  Patient and/or legal guardian expressed understanding and consented to Telemedicine visit: Yes   Presenting Concerns: Patient and/or family reports the following symptoms/concerns: Grief Counseling  Duration of problem: 3 years and ongoing; Severity of problem: moderate  Patient and/or Family's Strengths/Protective Factors: Sense of purpose  Goals Addressed: Patient will:  Reduce symptoms of: stress and Grief     Progress towards Goals: Ongoing  Interventions: Interventions utilized:  Motivational Interviewing, Mindfulness or Relaxation Training, and Supportive Counseling Standardized Assessments completed: PHQ-SADS     05/01/2022    5:34 PM  PHQ-SADS Last 3 Score only  Total GAD-7 Score 6  PHQ Adolescent Score 5      Assessment: Patient  currently experiencing grief. Patient continues to feel sad about losing loved ones in her life. Patient working to established boundaries from unhealthy behaviors in her family. [facility] recommended patient to write letters to those individuals for closure for herself. [facility] and patient discussed grief support groups in addition to counseling with [facility]. Patient is practicing self care. Patient has a trip to [place] from 04/16-04/21. [facility] and patient will meet again on 04/11.   Patient may benefit from ongoing counseling.  Plan: Follow up with behavioral health clinician on : 04/11 at 1:30 Pm video   I discussed the assessment and treatment plan with the patient and/or parent/guardian. They were provided an opportunity to ask questions and all were answered. They agreed with the plan and demonstrated an understanding of the instructions.   They were advised to call back or seek an in-person evaluation if the symptoms worsen or if the condition fails to improve as anticipated.  Yolanda Keller, MSW, [facility] Direct Dial:[phone]  Fax [phone] Main Office Phone: [phone] [address] [place], [place] [address] Website: [place], [place]

## 2022-06-06 NOTE — Progress Notes (Signed)
Internal Medicine Clinic Attending  Case discussed with Dr. Katsadouros at the time of the visit.  We reviewed the resident's history and exam and pertinent patient test results.  I agree with the assessment, diagnosis, and plan of care documented in the resident's note.  

## 2022-06-10 ENCOUNTER — Ambulatory Visit: Payer: Medicaid Other | Admitting: Dietician

## 2022-06-10 ENCOUNTER — Encounter: Payer: Self-pay | Admitting: Dietician

## 2022-06-10 VITALS — Wt 174.0 lb

## 2022-06-10 DIAGNOSIS — R7303 Prediabetes: Secondary | ICD-10-CM | POA: Diagnosis not present

## 2022-06-10 NOTE — Progress Notes (Signed)
  Medical Nutrition Therapy  Appt start time: 1130 end time:  1215 Total time: 30 minutes Visit # 1  Assessment:  Primary concerns today:  Patient states she wants to know how to eat healthy.  Patient was here today with her sister. She works all the time, sometimes she doesn't have time to eat.  Preferred Learning Style:  No preference indicated   Learning Readiness:  Ready  ANTHROPOMETRICS: Estimated body mass index is 25.7 kg/m as calculated from the following:   Height as of 05/30/22: 5\' 9"  (1.753 m).   Weight as of this encounter: 174 lb (78.9 kg).   Weight has gone up in  5 years  Wt Readings from Last 20 Encounters:  05/30/22 173 lb 8 oz (78.7 kg)  05/23/22 171 lb (77.6 kg)  05/01/22 178 lb (80.7 kg)  10/08/17 162 lb 14.4 oz (73.9 kg)  05/09/15 154 lb (69.9 kg)     SLEEP: Unknown.  MEDICATIONS: No diabetes medications BLOOD SUGAR:05/01/2022 128 mg/dl Hb A 1 C 5.9  1/59/47 DIETARY INTAKE: Usual eating pattern includes 3 meals and 0 snacks per day. Everyday foods include Salad, chicken winds  Avoided foods include: Fast food, soda, sweet tea. Processed food. Food Intolerances: Unknown   Dining Out 4 (times/week):  24-hr recall:  B ( AM): Skip breakfast 1/2 of the time. [place] bacon, cheese toast, 2 boil eggs. Water Snk ( AM):  None  L ( PM): Chicken or Salmon Salad, chicken winds. Snk ( PM): None D ( PM): Green vegetables,Meat.  Snk ( PM): None Beverages: water, wine, beer.  Usual physical activity: None    Progress Towards Goal(s):  In progress.    Nutritional Diagnosis:  NB-1.1 Food and nutrition-related knowledge deficit As related to lack of previus educations.  As evidenced by Hb A!C .5.9     Intervention:  Nutrition E-2 Comprehensive Nutrition Education. Instruction and training leading to in-depth nutrition-related knowledge or skills.  Action Goal:Trying not to skip breakfast. Eating more none starchy vegetables & fruit.  Be aware of  portion size  Outcome goal: Improve knowledge Coordination of care: Refer to [facility] for additionally education if needed  Teaching Method Utilized: Visual, Auditory,Hands on Handouts given during visit include:Booklet of DM prevention. Recipes for smoothie   Barriers to learning/adherence to lifestyle change:work load Demonstrated degree of understanding via:  Teach Back   Monitoring/Evaluation:  Dietary intake, exercise, at least 10 minutes everyday, and body weight prn  Norm Parcel, RD 06/10/2022 4:53 PM.

## 2022-06-10 NOTE — Patient Instructions (Addendum)
Healthy ideas for food:  You are doing great at choosing healthier drinks and less processed foods,   Suggest trying to eat a well balanced breakfast that is low in fat and has whole grains- whole grain toast or whole wheat crackers (Triscuit) . Cheese and fruit.   Use  whole fruit in your smoothie and limit fruit servings to 2 in your smoothie. ( Frozen Banana and frozen berries with spinach and yogurt or low fat milk)   Try to eat half your plate non-starchy vegetables, 1/4 your plate protein that is low in fat , and 1/4 your plate carbohydrate ( starchy vegetables, bread, rice, pasta, dried beans, corn. potatoes.    Your Exercise Prescription:    At least 150 minutes a week of moderate intensity exercise. Walking, bicycling, stationary bicycling, dancing.  (moderate intensity means to get  a little out of breath)  Do resistance exercise at least 2 times a week.  This can be yoga poses or strength training where you lift your own weight (think leg lifts or toe raises)  or light weights like cans of beans with your arms.   Flexibility and balance exercise- safe stretching and practicing balance is very important to health.    An A1c is an average blood sugar.  Food especially starches and sugars( both natural and added) makes blood sugar go up.  Physical activity makes blood sugar go down.  Moving after eating can help regulate your blood sugars.  Lab Results  Component Value Date   HGBA1C 5.9 (A) 05/01/2022   HGBA1C 5.6 10/08/2017   HGBA1C 5.3 05/09/2015    Lipid Panel     Component Value Date/Time   CHOL 231 (H) 05/01/2022 1642   TRIG 312 (H) 05/01/2022 1642   HDL 65 05/01/2022 1642   CHOLHDL 3.6 05/01/2022 1642   CHOLHDL 2.7 10/08/2017 1055   VLDL 23 10/08/2017 1055   LDLCALC 113 (H) 05/01/2022 1642   LABVLDL 53 (H) 05/01/2022 1642   Yolanda Keller [phone]

## 2022-06-13 ENCOUNTER — Encounter: Payer: Medicaid Other | Admitting: Radiology

## 2022-06-13 ENCOUNTER — Ambulatory Visit: Payer: Medicaid Other | Admitting: Licensed Clinical Social Worker

## 2022-06-13 DIAGNOSIS — Z566 Other physical and mental strain related to work: Secondary | ICD-10-CM

## 2022-06-13 NOTE — BH Specialist Note (Signed)
Integrated Behavioral Health via Telemedicine Visit  06/13/2022 CAMILLE ANTAR [id number]  Number of Integrated Behavioral Health Clinician visits: 3- Third Visit  Session Start time: 1000   Session End time: 1100  Total time in minutes: 60   Referring Provider: Belva Agee, MD Patient/Family location: At work, in vehicle while parked [facility] Provider location: Office All persons participating in visit: [facility] and Patient Types of Service: Video visit and Health & Behavioral Assessment/Intervention   I connected with Amaryllis Dyke  via  Telephone or Engineer, civil (consulting)  (Video is Surveyor, mining) and verified that I am speaking with the correct person using two identifiers. Discussed confidentiality: Yes    I discussed the limitations of telemedicine and the availability of in person appointments.  Discussed there is a possibility of technology failure and discussed alternative modes of communication if that failure occurs.   I discussed that engaging in this telemedicine visit, they consent to the provision of behavioral healthcare and the services will be billed under their insurance.   Patient and/or legal guardian expressed understanding and consented to Telemedicine visit: Yes    Presenting Concerns: Patient and/or family reports the following symptoms/concerns: Grief Counseling  Duration of problem: 3 years and ongoing; Severity of problem: moderate   Patient and/or Family's Strengths/Protective Factors: Sense of purpose   Goals Addressed: Patient will:  Reduce symptoms of: stress and Grief       Progress towards Goals: Ongoing   Interventions: Interventions utilized:  Motivational Interviewing, Mindfulness or Relaxation Training, and Supportive Counseling Standardized Assessments completed: PHQ-SADS       05/01/2022    5:34 PM  PHQ-SADS Last 3 Score only  Total GAD-7 Score 6  PHQ Adolescent Score 5         Assessment: Patient is practicing self care and has a trip to [place] from 04/16-04/21. Patient has many tasks to complete before departing for trip. Patient will document task and check them off as completed. Patient will show herself grace and understand if everything is not completed. Patient excited about trip and the individuals she is traveling with. Patient will write letters as a form of therapy and leave letters or burn letters in [place].   Patient may benefit from ongoing counseling.   Plan: Follow up with behavioral health clinician on : 05/09 at 3:00 Pm video     I discussed the assessment and treatment plan with the patient and/or parent/guardian. They were provided an opportunity to ask questions and all were answered. They agreed with the plan and demonstrated an understanding of the instructions.   They were advised to call back or seek an in-person evaluation if the symptoms worsen or if the condition fails to improve as anticipated.   Christen Butter, MSW, LCSW-A She/Her Behavioral Health Clinician [facility]  [facility] Direct Dial:[phone]  Fax [phone] Main Office Phone: [phone] [address] [place] [place], [place] [address] Website: [facility]  [place], [place]  [place]

## 2022-06-17 ENCOUNTER — Ambulatory Visit: Payer: Medicaid Other | Admitting: Podiatry

## 2022-06-17 ENCOUNTER — Encounter: Payer: Self-pay | Admitting: Podiatry

## 2022-06-17 DIAGNOSIS — M722 Plantar fascial fibromatosis: Secondary | ICD-10-CM

## 2022-06-17 NOTE — Progress Notes (Signed)
   Chief Complaint  Patient presents with   Foot Pain    Follow up Pf bilateral   "They have been feeling better. I've tried to wear the braces more"    Subjective: 52 y.o. female presenting today for follow-up evaluation of plantar fasciitis to the bilateral feet.  Patient states that they have been better with the stretching exercises.  She did not take the Medrol Dosepak or the meloxicam.  She tries to wear the plantar fascia braces daily   Past Medical History:  Diagnosis Date   Endometriosis    Hearing loss of both ears    Menopause    Seasonal depression    Vertigo      Objective: Physical Exam General: The patient is alert and oriented x3 in no acute distress.  Dermatology: Skin is warm, dry and supple bilateral lower extremities. Negative for open lesions or macerations bilateral.   Vascular: Dorsalis Pedis and Posterior Tibial pulses palpable bilateral.  Capillary fill time is immediate to all digits.  Neurological: Epicritic and protective threshold intact bilateral.   Musculoskeletal: There continues to be tenderness to palpation to the plantar aspect of the bilateral heels along the plantar fascia. All other joints range of motion within normal limits bilateral. Strength 5/5 in all groups bilateral.   Radiographic exam B/L feet 05/13/2022: Normal osseous mineralization. Joint spaces preserved. No fracture/dislocation/boney destruction. No other soft tissue abnormalities or radiopaque foreign bodies.   Assessment: 1. plantar fasciitis bilateral feet -Patient evaluated -Continue wearing plantar fascial braces bilateral -Recommend meloxicam 15 mg daily.  Patient was under the impression they were strictly a painkiller.  I explained that these are anti-inflammatories to help reduce the inflammation of the plantar fascia -Patient declined the Medrol Dosepak that was prescribed last visit.  She is currently in menopause and she does not want to take any medication that  can cause her to be hot -Return to clinic as needed  Felecia Shelling, DPM [facility]  Dr. Felecia Shelling, DPM    [address] [place], [place] [address]                Office [phone]  Fax [phone]

## 2022-06-25 ENCOUNTER — Ambulatory Visit (INDEPENDENT_AMBULATORY_CARE_PROVIDER_SITE_OTHER): Payer: Medicaid Other | Admitting: Internal Medicine

## 2022-06-25 ENCOUNTER — Encounter: Payer: Self-pay | Admitting: Internal Medicine

## 2022-06-25 VITALS — BP 103/70 | HR 88 | Temp 98.1°F | Ht 68.0 in | Wt 173.3 lb

## 2022-06-25 DIAGNOSIS — N951 Menopausal and female climacteric states: Secondary | ICD-10-CM | POA: Diagnosis not present

## 2022-06-25 DIAGNOSIS — G4733 Obstructive sleep apnea (adult) (pediatric): Secondary | ICD-10-CM

## 2022-06-25 DIAGNOSIS — G44209 Tension-type headache, unspecified, not intractable: Secondary | ICD-10-CM | POA: Diagnosis not present

## 2022-06-25 DIAGNOSIS — R0981 Nasal congestion: Secondary | ICD-10-CM

## 2022-06-25 NOTE — Progress Notes (Unsigned)
   CC: Headache  HPI:[name] is a 52 y.o. female who presents for evaluation of headache. Please see individual problem based A/P for details.  Patient is 51yof who presents with complaints of a headache. She has been evaluated for tension type headache previously, reports that it has worsened, and is seeking reassurance.   Depression, PHQ-9: Based on the patients  Flowsheet Row Office Visit from 06/25/2022 in [facility]  PHQ-9 Total Score 5      score we have 5.  Past Medical History:  Diagnosis Date   Endometriosis    Hearing loss of both ears    Menopause    Seasonal depression    Vertigo    Review of Systems:   See HPI  Physical Exam: Vitals:   06/25/22 1527  BP: 103/70  Pulse: 88  Temp: 98.1 F (36.7 C)  TempSrc: Oral  SpO2: 100%  Weight: 173 lb 4.8 oz (78.6 kg)  Height:  (1.727 m)   General: nad HEENT: Conjunctiva nl , antiicteric sclerae, moist mucous membranes, no exudate or erythema Cardiovascular: Normal rate, regular rhythm.  No murmurs, rubs, or gallops Pulmonary : Equal breath sounds, No wheezes, rales, or rhonchi Abdominal: soft, nontender,  bowel sounds present Ext: No edema in lower extremities, no tenderness to palpation of lower extremities.   Assessment & Plan:   See Encounters Tab for problem based charting.  Tension headache Complains of headache for past week. She describes pain in her shoulders, neck, and base of occiput. Says it is a sharp pain worsened with movement. No radiation of pain down arms. No weakness, numbness. No vision changes, dizziness, nausea, vomiting. She has not had any facial pain nor rhinorrhea. She does admit that she has not utilized voltaren gel nor attempted thermotherapy, massage, or stretches of neck that were previously recommended.  Neuro exam is nonfocal, no facial tenderness. Does endorse tenderness over insertion of paraspinal muscles at occiput.  HA is most likely  tension type. Recommended she employ previously advised therapies including tylenol, NSAIDs, heat, stretching, and massages.   Vasomotor symptoms due to menopause Has been taking estropause OTC. She did discuss this with PCP and OBGYN. No complaints at this time.  She is still smoking.  HDS. Normal exam.  Higher risk of clot given active tobacco use and estrogen products, though she does nothave any prior history of vascular disease nor PE. Recommended that she minimize smoking as much as she is able if she is going to continue using OTC estrogen replacements.   OSA (obstructive sleep apnea) Patient endorses snoring noted by sister. Also states that sister described patient would stop breathing. Patient does not awake feeling refreshed.  STOPBANG score 4 intermediate.  Refer for split night sleep study.  Sinus congestion Continues to complain that it feels like "something coming down into my throat choking me" when she lies back. She has not utilized her flonase.  Symptoms most consistent with PND. Advised flonase.    Patient discussed with Dr. Mayford Knife

## 2022-06-25 NOTE — Patient Instructions (Addendum)
Dear Mrs. Joss,  Thank you for trusting [place] with your care.   We discussed your headache today. It is most consistent with a tension type headache. We recommend stretching, heat packs/ warm washcloths, massages, and the occasional use of NSAIDs. If this continues, we can try a medication called amitriptyline for you.  Please use the flonase regularly.  I encourage you to cut back on the tobacco use as much as you are able if you continue to use the Estropause.

## 2022-06-26 ENCOUNTER — Encounter: Payer: Self-pay | Admitting: Internal Medicine

## 2022-06-26 DIAGNOSIS — G4733 Obstructive sleep apnea (adult) (pediatric): Secondary | ICD-10-CM | POA: Insufficient documentation

## 2022-06-26 NOTE — Assessment & Plan Note (Signed)
Continues to complain that it feels like "something coming down into my throat choking me" when she lies back. She has not utilized her flonase.  Symptoms most consistent with PND. Advised flonase.

## 2022-06-26 NOTE — Assessment & Plan Note (Signed)
Has been taking estropause OTC. She did discuss this with PCP and OBGYN. No complaints at this time.  She is still smoking.  HDS. Normal exam.  Higher risk of clot given active tobacco use and estrogen products, though she does nothave any prior history of vascular disease nor PE. Recommended that she minimize smoking as much as she is able if she is going to continue using OTC estrogen replacements.

## 2022-06-26 NOTE — Assessment & Plan Note (Signed)
Complains of headache for past week. She describes pain in her shoulders, neck, and base of occiput. Says it is a sharp pain worsened with movement. No radiation of pain down arms. No weakness, numbness. No vision changes, dizziness, nausea, vomiting. She has not had any facial pain nor rhinorrhea. She does admit that she has not utilized voltaren gel nor attempted thermotherapy, massage, or stretches of neck that were previously recommended.  Neuro exam is nonfocal, no facial tenderness. Does endorse tenderness over insertion of paraspinal muscles at occiput.  HA is most likely tension type. Recommended she employ previously advised therapies including tylenol, NSAIDs, heat, stretching, and massages.

## 2022-06-26 NOTE — Assessment & Plan Note (Signed)
Patient endorses snoring noted by sister. Also states that sister described patient would stop breathing. Patient does not awake feeling refreshed.  STOPBANG score 4 intermediate.  Refer for split night sleep study.

## 2022-07-01 NOTE — Progress Notes (Signed)
Internal Medicine Clinic Attending ° °Case discussed with Dr. Gawaluck  At the time of the visit.  We reviewed the resident’s history and exam and pertinent patient test results.  I agree with the assessment, diagnosis, and plan of care documented in the resident’s note.  °

## 2022-07-05 ENCOUNTER — Ambulatory Visit (AMBULATORY_SURGERY_CENTER): Payer: Medicaid Other | Admitting: *Deleted

## 2022-07-05 VITALS — Ht 69.0 in | Wt 169.0 lb

## 2022-07-05 DIAGNOSIS — Z1211 Encounter for screening for malignant neoplasm of colon: Secondary | ICD-10-CM

## 2022-07-05 MED ORDER — GOLYTELY 236 G PO SOLR
ORAL | 0 refills | Status: DC
Start: 2022-07-05 — End: 2023-02-04

## 2022-07-05 NOTE — Progress Notes (Signed)

## 2022-07-19 ENCOUNTER — Encounter: Payer: Self-pay | Admitting: Gastroenterology

## 2022-07-19 ENCOUNTER — Ambulatory Visit (AMBULATORY_SURGERY_CENTER): Payer: Medicaid Other | Admitting: Gastroenterology

## 2022-07-19 VITALS — BP 114/66 | HR 70 | Temp 97.7°F | Resp 14 | Ht 69.0 in | Wt 169.0 lb

## 2022-07-19 DIAGNOSIS — K635 Polyp of colon: Secondary | ICD-10-CM | POA: Diagnosis not present

## 2022-07-19 DIAGNOSIS — D124 Benign neoplasm of descending colon: Secondary | ICD-10-CM

## 2022-07-19 DIAGNOSIS — D125 Benign neoplasm of sigmoid colon: Secondary | ICD-10-CM

## 2022-07-19 DIAGNOSIS — Z1211 Encounter for screening for malignant neoplasm of colon: Secondary | ICD-10-CM | POA: Diagnosis not present

## 2022-07-19 MED ORDER — SODIUM CHLORIDE 0.9 % IV SOLN
500.0000 mL | Freq: Once | INTRAVENOUS | Status: DC
Start: 2022-07-19 — End: 2022-07-19

## 2022-07-19 NOTE — Progress Notes (Signed)
Called to room to assist during endoscopic procedure.  Patient ID and intended procedure confirmed with present staff. Received instructions for my participation in the procedure from the performing physician.  

## 2022-07-19 NOTE — Patient Instructions (Addendum)
Resume previous diet Continue present medications Await pathology results  Handouts/information given for polyps  YOU HAD AN ENDOSCOPIC PROCEDURE TODAY AT THE [facility]:   Refer to the procedure report that was given to you for any specific questions about what was found during the examination.  If the procedure report does not answer your questions, please call your gastroenterologist to clarify.  If you requested that your care partner not be given the details of your procedure findings, then the procedure report has been included in a sealed envelope for you to review at your convenience later.  YOU SHOULD EXPECT: Some feelings of bloating in the abdomen. Passage of more gas than usual.  Walking can help get rid of the air that was put into your GI tract during the procedure and reduce the bloating. If you had a lower endoscopy (such as a colonoscopy or flexible sigmoidoscopy) you may notice spotting of blood in your stool or on the toilet paper. If you underwent a bowel prep for your procedure, you may not have a normal bowel movement for a few days.  Please Note:  You might notice some irritation and congestion in your nose or some drainage.  This is from the oxygen used during your procedure.  There is no need for concern and it should clear up in a day or so.  SYMPTOMS TO REPORT IMMEDIATELY:  Following lower endoscopy (colonoscopy):  Excessive amounts of blood in the stool  Significant tenderness or worsening of abdominal pains  Swelling of the abdomen that is new, acute  Fever of 100F or higher  For urgent or emergent issues, a gastroenterologist can be reached at any hour by calling [phone]. Do not use MyChart messaging for urgent concerns.    DIET:  We do recommend a small meal at first, but then you may proceed to your regular diet.  Drink plenty of fluids but you should avoid alcoholic beverages for 24 hours.  ACTIVITY:  You should plan to take it easy for  the rest of today and you should NOT DRIVE or use heavy machinery until tomorrow (because of the sedation medicines used during the test).    FOLLOW UP: Our staff will call the number listed on your records the next business day following your procedure.  We will call around 7:15- 8:00 am to check on you and address any questions or concerns that you may have regarding the information given to you following your procedure. If we do not reach you, we will leave a message.     If any biopsies were taken you will be contacted by phone or by letter within the next 1-3 weeks.  Please call us at [phone] if you have not heard about the biopsies in 3 weeks.    SIGNATURES/CONFIDENTIALITY: You and/or your care partner have signed paperwork which will be entered into your electronic medical record.  These signatures attest to the fact that that the information above on your After Visit Summary has been reviewed and is understood.  Full responsibility of the confidentiality of this discharge information lies with you and/or your care-partner. 

## 2022-07-19 NOTE — Op Note (Signed)
[facility] Patient Name: Yolanda Keller Procedure Date: 07/19/2022 11:05 AM MRN: [id number] Endoscopist: Lorin Picket E. Tomasa Rand , MD, [id number] Age: 52 Referring MD:  Date of Birth: 03-Aug-1970 Gender: Female Account #: [id number] Procedure:                Colonoscopy Indications:              Screening for colorectal malignant neoplasm, This                            is the patient's first colonoscopy Medicines:                Monitored Anesthesia Care Procedure:                Pre-Anesthesia Assessment:                           - Prior to the procedure, a History and Physical                            was performed, and patient medications and                            allergies were reviewed. The patient's tolerance of                            previous anesthesia was also reviewed. The risks                            and benefits of the procedure and the sedation                            options and risks were discussed with the patient.                            All questions were answered, and informed consent                            was obtained. Prior Anticoagulants: The patient has                            taken no anticoagulant or antiplatelet agents. ASA                            Grade Assessment: II - A patient with mild systemic                            disease. After reviewing the risks and benefits,                            the patient was deemed in satisfactory condition to                            undergo the procedure.  After obtaining informed consent, the colonoscope                            was passed under direct vision. Throughout the                            procedure, the patient's blood pressure, pulse, and                            oxygen saturations were monitored continuously. The                            Olympus SN [id number] was introduced through the anus                            and  advanced to the the cecum, identified by                            appendiceal orifice and ileocecal valve. The                            colonoscopy was performed without difficulty. The                            patient tolerated the procedure well. The quality                            of the bowel preparation was good. The ileocecal                            valve, appendiceal orifice, and rectum were                            photographed. The bowel preparation used was                            GoLYTELY via split dose instruction. Scope In: 11:16:58 AM Scope Out: 11:31:45 AM Scope Withdrawal Time: 0 hours 10 minutes 18 seconds  Total Procedure Duration: 0 hours 14 minutes 47 seconds  Findings:                 The perianal and digital rectal examinations were                            normal. Pertinent negatives include normal                            sphincter tone and no palpable rectal lesions.                           Two sessile polyps were found in the sigmoid colon                            and descending colon. The polyps were 3 to 5  mm in                            size. These polyps were removed with a cold snare.                            Resection and retrieval were complete. Estimated                            blood loss was minimal.                           The exam was otherwise normal throughout the                            examined colon.                           The retroflexed view of the distal rectum and anal                            verge was normal and showed no anal or rectal                            abnormalities. Complications:            No immediate complications. Estimated Blood Loss:     Estimated blood loss was minimal. Impression:               - Two 3 to 5 mm polyps in the sigmoid colon and in                            the descending colon, removed with a cold snare.                            Resected and retrieved.                            - The distal rectum and anal verge are normal on                            retroflexion view. Recommendation:           - Patient has a contact number available for                            emergencies. The signs and symptoms of potential                            delayed complications were discussed with the                            patient. Return to normal activities tomorrow.                            Written discharge instructions were provided  to the                            patient.                           - Resume previous diet.                           - Continue present medications.                           - Await pathology results.                           - Repeat colonoscopy (date not yet determined) for                            surveillance based on pathology results. Esterlene Atiyeh E. Tomasa Rand, MD 07/19/2022 11:35:34 AM This report has been signed electronically.

## 2022-07-19 NOTE — Progress Notes (Signed)
[facility] History and Physical   Primary Care Physician:  Belva Agee, MD   Reason for Procedure:   Colon cancer screening  Plan:    Screening colonoscopy     HPI: Yolanda Keller is a 52 y.o. female undergoing initial average risk screening colonoscopy.  She has no family history of colon cancer and no chronic GI symptoms.    Past Medical History:  Diagnosis Date   Allergy    SEASONAL   Anemia    Anxiety    Arthritis    Endometriosis    Hearing loss of both ears    Hyperlipidemia    Menopause    Pre-diabetes    Seasonal depression (HCC)    Vertigo     Past Surgical History:  Procedure Laterality Date   CERVICAL BIOPSY  W/ LOOP ELECTRODE EXCISION     SALPINGECTOMY Left     Prior to Admission medications   Medication Sig Start Date End Date Taking? Authorizing Provider  Cholecalciferol (VITAMIN D-3 PO) Take by mouth. TAKE ONE DAILY    [provider]  Cyanocobalamin (VITAMIN B 12 PO) Take by mouth. TAKE ONE DAILY    [provider]  Ferrous Sulfate (IRON PO) Take by mouth daily. TAKE ONE DAILY    [provider]  Flaxseed, Linseed, (FLAX SEEDS PO) Take by mouth daily. ONE DAILY    [provider]  fluticasone (FLONASE) 50 MCG/ACT nasal spray Place into both nostrils daily.    [provider]  MAGNESIUM PO Take by mouth. TAKE ONE DAILY    [provider]  OVER THE COUNTER MEDICATION daily. UVA URSI TAKE 2 DAILY    [provider]  OVER THE COUNTER MEDICATION daily. ESTO PAUSE ONE DAILY    [provider]  polyethylene glycol (GOLYTELY) 236 g solution MAY USE GENERIC 07/05/22   Jenel Lucks, MD    Current Outpatient Medications  Medication Sig Dispense Refill   Cholecalciferol (VITAMIN D-3 PO) Take by mouth. TAKE ONE DAILY     Cyanocobalamin (VITAMIN B 12 PO) Take by mouth. TAKE ONE DAILY     Ferrous Sulfate (IRON PO) Take by mouth daily. TAKE ONE DAILY      Flaxseed, Linseed, (FLAX SEEDS PO) Take by mouth daily. ONE DAILY     fluticasone (FLONASE) 50 MCG/ACT nasal spray Place into both nostrils daily.     MAGNESIUM PO Take by mouth. TAKE ONE DAILY     OVER THE COUNTER MEDICATION daily. UVA URSI TAKE 2 DAILY     OVER THE COUNTER MEDICATION daily. ESTO PAUSE ONE DAILY     polyethylene glycol (GOLYTELY) 236 g solution MAY USE GENERIC 4000 mL 0   Current Facility-Administered Medications  Medication Dose Route Frequency Provider Last Rate Last Admin   0.9 %  sodium chloride infusion  500 mL Intravenous Once Jenel Lucks, MD        Allergies as of 07/19/2022   (No Known Allergies)    Family History  Problem Relation Age of Onset   Hypertension Mother    Cancer Father        prostate and bladder   Colon polyps Sister    Breast cancer Sister 35   Diabetes Maternal Aunt    Breast cancer Maternal Aunt    Breast cancer Maternal Grandfather    Colon cancer Neg Hx    Crohn's disease Neg Hx    Esophageal cancer Neg Hx    Rectal cancer Neg Hx  Stomach cancer Neg Hx    Ulcerative colitis Neg Hx     Social History   Socioeconomic History   Marital status: Single    Spouse name: Not on file   Number of children: Not on file   Years of education: Not on file   Highest education level: Not on file  Occupational History   Not on file  Tobacco Use   Smoking status: Some Days    Types: Cigars   Smokeless tobacco: Never  Vaping Use   Vaping Use: Never used  Substance and Sexual Activity   Alcohol use: Yes    Alcohol/week: 3.0 standard drinks of alcohol    Types: 3 Glasses of wine per week    Comment: social   Drug use: No   Sexual activity: Not Currently    Partners: Male    Birth control/protection: Post-menopausal    Comment: First @ 60 y/o, 5 Partners, Hx of CT+  Other Topics Concern   Not on file  Social History Narrative   Not on file   Social Determinants of Health   Financial Resource Strain: Not on file   Food Insecurity: Not on file  Transportation Needs: Not on file  Physical Activity: Not on file  Stress: Not on file  Social Connections: Not on file  Intimate Partner Violence: Not on file    Review of Systems:  All other review of systems negative except as mentioned in the HPI.  Physical Exam: Vital signs BP 111/66   Pulse 90   Temp 97.7 F (36.5 C)   Ht 5\' 9"  (1.753 m)   Wt 169 lb (76.7 kg)   LMP 09/01/2017 (Approximate)   SpO2 100%   BMI 24.96 kg/m   General:   Alert,  Well-developed, well-nourished, pleasant and cooperative in NAD Airway:  Mallampati 2 Lungs:  Clear throughout to auscultation.   Heart:  Regular rate and rhythm; no murmurs, clicks, rubs,  or gallops. Abdomen:  Soft, nontender and nondistended. Normal bowel sounds.   Neuro/Psych:  Normal mood and affect. A and O x 3   Treg Diemer E. Tomasa Rand, MD [facility]

## 2022-07-19 NOTE — Progress Notes (Signed)
A and O x3. Report to RN. Tolerated MAC anesthesia well. 

## 2022-07-19 NOTE — Progress Notes (Signed)
Pt's states no medical or surgical changes since previsit or office visit. 

## 2022-07-26 NOTE — Progress Notes (Signed)
Yolanda Keller,  Good news: the polyps that I removed during your recent examination were NOT precancerous.  You should continue to follow current colorectal cancer screening guidelines with a repeat colonoscopy in 10 years.    If you develop any new rectal bleeding, abdominal pain or significant bowel habit changes, please contact me before then.

## 2022-08-05 ENCOUNTER — Encounter: Payer: Self-pay | Admitting: Student

## 2023-01-22 NOTE — Telephone Encounter (Signed)
Error

## 2023-01-24 NOTE — Telephone Encounter (Signed)
Error

## 2023-02-04 ENCOUNTER — Encounter: Payer: Self-pay | Admitting: Nurse Practitioner

## 2023-02-04 ENCOUNTER — Ambulatory Visit: Payer: Medicaid Other | Admitting: Nurse Practitioner

## 2023-02-04 VITALS — BP 116/74 | HR 62 | Resp 16 | Wt 171.0 lb

## 2023-02-04 DIAGNOSIS — L659 Nonscarring hair loss, unspecified: Secondary | ICD-10-CM | POA: Diagnosis not present

## 2023-02-04 DIAGNOSIS — Z803 Family history of malignant neoplasm of breast: Secondary | ICD-10-CM

## 2023-02-04 DIAGNOSIS — R232 Flushing: Secondary | ICD-10-CM | POA: Diagnosis not present

## 2023-02-04 DIAGNOSIS — R5383 Other fatigue: Secondary | ICD-10-CM

## 2023-02-04 DIAGNOSIS — R635 Abnormal weight gain: Secondary | ICD-10-CM | POA: Diagnosis not present

## 2023-02-04 NOTE — Progress Notes (Signed)
   Acute Office Visit  Subjective:    Patient ID: Yolanda Keller, female    DOB: [birth year], 52 y.o.   MRN: [id number]   HPI 52 y.o. presents today for menopausal symptoms. Complaints of night sweats, hot flashes, sleep disturbance, hair loss, fatigue and weight gain. Sometimes hot flashes can occur multiple times per hour. Postmenopausal since 2019. Symptoms are not new but are becoming intolerable. She has been trying OTC options with no improvement. Half sister with history of breast cancer at 33, maternal aunt and MGF with history of breast cancer.   Patient's last menstrual period was 09/01/2017 (approximate).    Review of Systems  Constitutional:  Positive for fatigue and unexpected weight change.  Endocrine: Positive for heat intolerance.  Skin:        Hair loss  Psychiatric/Behavioral:  Positive for sleep disturbance.        Objective:    Physical Exam Constitutional:      Appearance: Normal appearance.  Psychiatric:        Attention and Perception: Attention normal.        Mood and Affect: Mood normal.        Speech: Speech normal.        Behavior: Behavior normal.     BP 116/74   Pulse 62   Resp 16   Wt 171 lb (77.6 kg)   LMP 09/01/2017 (Approximate)   BMI 25.25 kg/m  Wt Readings from Last 3 Encounters:  02/04/23 171 lb (77.6 kg)  07/19/22 169 lb (76.7 kg)  07/05/22 169 lb (76.7 kg)         Problem List Items Addressed This Visit   None Visit Diagnoses     Hair loss    -  Primary   Relevant Orders   TSH   VITAMIN D 25 Hydroxy (Vit-D Deficiency, Fractures)   Weight gain       Relevant Orders   TSH   Hot flashes       Relevant Orders   TSH   Fatigue, unspecified type       Relevant Orders   CBC with Differential/Platelet   Comprehensive metabolic panel   Vitamin B12   TSH   VITAMIN D 25 Hydroxy (Vit-D Deficiency, Fractures)   Family history of breast cancer       Relevant Orders   Ambulatory referral to Genetics      Plan: CBC,  CMP, TSH, B12 and Vit D today. If normal, we discussed option for management of menopausal symptoms to include HRT, Veozah, SSRI/SNRI. Interested in HRT. Recommend referral to genetics for family history of breast cancer and she is agreeable. UTD on mammogram. Follow up to be determined.      Olivia Mackie DNP, 2:59 PM 02/04/2023

## 2023-02-05 ENCOUNTER — Other Ambulatory Visit: Payer: Self-pay | Admitting: Nurse Practitioner

## 2023-02-05 DIAGNOSIS — E559 Vitamin D deficiency, unspecified: Secondary | ICD-10-CM

## 2023-02-05 LAB — CBC WITH DIFFERENTIAL/PLATELET
Absolute Lymphocytes: 3671 {cells}/uL (ref 850–3900)
Absolute Monocytes: 546 {cells}/uL (ref 200–950)
Basophils Absolute: 50 {cells}/uL (ref 0–200)
Basophils Relative: 0.6 %
Eosinophils Absolute: 160 {cells}/uL (ref 15–500)
Eosinophils Relative: 1.9 %
HCT: 43 % (ref 35.0–45.0)
Hemoglobin: 14.3 g/dL (ref 11.7–15.5)
MCH: 27.5 pg (ref 27.0–33.0)
MCHC: 33.3 g/dL (ref 32.0–36.0)
MCV: 82.7 fL (ref 80.0–100.0)
MPV: 10.1 fL (ref 7.5–12.5)
Monocytes Relative: 6.5 %
Neutro Abs: 3973 {cells}/uL (ref 1500–7800)
Neutrophils Relative %: 47.3 %
Platelets: 271 10*3/uL (ref 140–400)
RBC: 5.2 10*6/uL — ABNORMAL HIGH (ref 3.80–5.10)
RDW: 12.5 % (ref 11.0–15.0)
Total Lymphocyte: 43.7 %
WBC: 8.4 10*3/uL (ref 3.8–10.8)

## 2023-02-05 LAB — COMPREHENSIVE METABOLIC PANEL
AG Ratio: 1.5 (calc) (ref 1.0–2.5)
ALT: 31 U/L — ABNORMAL HIGH (ref 6–29)
AST: 22 U/L (ref 10–35)
Albumin: 4.8 g/dL (ref 3.6–5.1)
Alkaline phosphatase (APISO): 61 U/L (ref 37–153)
BUN: 15 mg/dL (ref 7–25)
CO2: 25 mmol/L (ref 20–32)
Calcium: 9.8 mg/dL (ref 8.6–10.4)
Chloride: 104 mmol/L (ref 98–110)
Creat: 0.62 mg/dL (ref 0.50–1.03)
Globulin: 3.2 g/dL (ref 1.9–3.7)
Glucose, Bld: 89 mg/dL (ref 65–99)
Potassium: 4.9 mmol/L (ref 3.5–5.3)
Sodium: 139 mmol/L (ref 135–146)
Total Bilirubin: 0.3 mg/dL (ref 0.2–1.2)
Total Protein: 8 g/dL (ref 6.1–8.1)

## 2023-02-05 LAB — VITAMIN D 25 HYDROXY (VIT D DEFICIENCY, FRACTURES): Vit D, 25-Hydroxy: 9 ng/mL — ABNORMAL LOW (ref 30–100)

## 2023-02-05 LAB — VITAMIN B12: Vitamin B-12: 640 pg/mL (ref 200–1100)

## 2023-02-05 LAB — TSH: TSH: 1.04 m[IU]/L

## 2023-02-05 MED ORDER — VITAMIN D (ERGOCALCIFEROL) 1.25 MG (50000 UNIT) PO CAPS: 50000.0000 [IU] | ORAL_CAPSULE | ORAL | 0 refills | Status: DC

## 2023-03-04 ENCOUNTER — Encounter: Payer: Self-pay | Admitting: Nurse Practitioner

## 2023-03-04 ENCOUNTER — Ambulatory Visit: Payer: Medicaid Other | Admitting: Nurse Practitioner

## 2023-03-04 VITALS — BP 110/64

## 2023-03-04 DIAGNOSIS — E559 Vitamin D deficiency, unspecified: Secondary | ICD-10-CM

## 2023-03-04 DIAGNOSIS — Z803 Family history of malignant neoplasm of breast: Secondary | ICD-10-CM | POA: Diagnosis not present

## 2023-03-04 DIAGNOSIS — N951 Menopausal and female climacteric states: Secondary | ICD-10-CM

## 2023-03-04 NOTE — Progress Notes (Signed)
   Acute Office Visit  Subjective:    Patient ID: Yolanda Keller, female    DOB: 04/22/1970, 52 y.o.   MRN: [id number]   HPI 52 y.o. presents today for follow up. Seen 02/04/23 with complaints of night sweats, hot flashes, sleep disturbance, hair loss, fatigue and weight gain. Sometimes hot flashes can occur multiple times per hour. Postmenopausal since 2019. Symptoms are not new but are becoming intolerable. She has tried OTC options with no improvement. Discussed management options for menopausal symptoms at that visit. Vit D a 9 at that time. Prescribed 50,000 international units weekly x 12 weeks but patient accidentally took 1 per day. She then started 2000 international units daily. Did notice improvement in energy and less hot flashes. Half sister with history of breast cancer at 34, maternal aunt and MGF with history of breast cancer. Genetics referral in process, scheduled 04/01/23.   Patient's last menstrual period was 09/01/2017 (approximate).    Review of Systems  Constitutional:  Positive for fatigue.  Endocrine: Positive for heat intolerance.  Psychiatric/Behavioral:  Positive for sleep disturbance.        Objective:    Physical Exam Constitutional:      Appearance: Normal appearance.     BP 110/64   LMP 09/01/2017 (Approximate)  Wt Readings from Last 3 Encounters:  02/04/23 171 lb (77.6 kg)  07/19/22 169 lb (76.7 kg)  07/05/22 169 lb (76.7 kg)       Assessment & Plan:   Problem List Items Addressed This Visit   None Visit Diagnoses       Menopausal symptoms    -  Primary     Family history of breast cancer         Vitamin D  deficiency       Relevant Orders   VITAMIN D  25 Hydroxy (Vit-D Deficiency, Fractures)      Plan: Will check Vit D today and adjust supplement as appropriate. Again reviewed management options for menopausal symptoms. Will discuss again after she sees genetics.   Return if symptoms worsen or fail to improve.    Yolanda DELENA Shutter DNP, 11:47 AM 03/04/2023

## 2023-03-05 LAB — VITAMIN D 25 HYDROXY (VIT D DEFICIENCY, FRACTURES): Vit D, 25-Hydroxy: 45 ng/mL (ref 30–100)

## 2023-03-07 ENCOUNTER — Encounter: Payer: Self-pay | Admitting: Nurse Practitioner

## 2023-03-31 ENCOUNTER — Other Ambulatory Visit: Payer: Self-pay | Admitting: Nurse Practitioner

## 2023-03-31 DIAGNOSIS — Z0000 Encounter for general adult medical examination without abnormal findings: Secondary | ICD-10-CM

## 2023-04-01 ENCOUNTER — Encounter: Payer: Self-pay | Admitting: Genetic Counselor

## 2023-04-01 ENCOUNTER — Inpatient Hospital Stay: Payer: Medicaid Other

## 2023-04-01 ENCOUNTER — Inpatient Hospital Stay: Payer: Medicaid Other | Attending: Genetic Counselor | Admitting: Genetic Counselor

## 2023-04-01 ENCOUNTER — Other Ambulatory Visit: Payer: Self-pay | Admitting: Genetic Counselor

## 2023-04-01 DIAGNOSIS — Z8042 Family history of malignant neoplasm of prostate: Secondary | ICD-10-CM | POA: Insufficient documentation

## 2023-04-01 DIAGNOSIS — Z803 Family history of malignant neoplasm of breast: Secondary | ICD-10-CM | POA: Diagnosis not present

## 2023-04-01 DIAGNOSIS — Z8052 Family history of malignant neoplasm of bladder: Secondary | ICD-10-CM | POA: Diagnosis not present

## 2023-04-01 LAB — GENETIC SCREENING ORDER

## 2023-04-01 NOTE — Progress Notes (Signed)
REFERRING PROVIDER: Olivia Mackie, NP [address] [place],  [place] [address]  PRIMARY PROVIDER:  Morrie Sheldon, MD  PRIMARY REASON FOR VISIT:  1. Family history of breast cancer   2. Family history of prostate cancer   3. Family history of bladder cancer      HISTORY OF PRESENT ILLNESS:   Ms. Montijo, a 53 y.o. female, was seen for a [place] cancer genetics consultation at the request of Dr. Earlene Plater due to a family history of breast cancer.  Ms. Fahey presents to clinic today to discuss the possibility of a hereditary predisposition to cancer, genetic testing, and to further clarify her future cancer risks, as well as potential cancer risks for family members.   Ms. Balla is a 53 y.o. female with no personal history of cancer.  Her paternal half sister reportedly had started the genetic testing process but has not received her results yet.  The patient has bi-lineal risk with her maternal grandfather being diagnosed with breast cancer.  CANCER HISTORY:  Oncology History   No history exists.     RISK FACTORS:  Menarche was at age 64.  First live birth at age N/A.  OCP use for approximately 0 years.  Ovaries intact: yes.  Hysterectomy: no.  Menopausal status: perimenopausal.  HRT use: 0 years. Colonoscopy: yes; normal. Mammogram within the last year: yes. Number of breast biopsies: 0. Up to date with pelvic exams: yes. Any excessive radiation exposure in the past: no  Past Medical History:  Diagnosis Date   Allergy    SEASONAL   Anemia    Anxiety    Arthritis    Endometriosis    Family history of bladder cancer    Family history of breast cancer    Family history of prostate cancer    Hearing loss of both ears    Hyperlipidemia    Menopause    Pre-diabetes    Seasonal depression (HCC)    Vertigo     Past Surgical History:  Procedure Laterality Date   CERVICAL BIOPSY  W/ LOOP ELECTRODE EXCISION     SALPINGECTOMY Left      Social History   Socioeconomic History   Marital status: Single    Spouse name: Not on file   Number of children: Not on file   Years of education: Not on file   Highest education level: Not on file  Occupational History   Not on file  Tobacco Use   Smoking status: Some Days    Types: Cigars   Smokeless tobacco: Never  Vaping Use   Vaping status: Never Used  Substance and Sexual Activity   Alcohol use: Not Currently    Comment: social   Drug use: No   Sexual activity: Not Currently    Partners: Male    Birth control/protection: Post-menopausal    Comment: First @ 3 y/o, 5 Partners, Hx of CT+  Other Topics Concern   Not on file  Social History Narrative   Not on file   Social Drivers of Health   Financial Resource Strain: Not on file  Food Insecurity: Not on file  Transportation Needs: Not on file  Physical Activity: Not on file  Stress: Not on file  Social Connections: Not on file     FAMILY HISTORY:  We obtained a detailed, 4-generation family history.  Significant diagnoses are listed below: Family History  Problem Relation Age of Onset   Hypertension Mother    Prostate cancer  Father 75   Bladder Cancer Father 71   Diabetes Maternal Aunt    Breast cancer Maternal Aunt    Breast cancer Maternal Grandfather    Breast cancer Half-Sister 67   Colon polyps Half-Sister    Lymphoma Cousin 52       pat first cousin   Colon cancer Neg Hx    Crohn's disease Neg Hx    Esophageal cancer Neg Hx    Rectal cancer Neg Hx    Stomach cancer Neg Hx    Ulcerative colitis Neg Hx      The patient does not have children. She has a maternal half sister who is cancer free and a paternal half sister and brother.  The paternal half sister was diagnosed with breast cancer at 65.  The patient's parents are living.  The patient's mother does not have cancer.  She has three sisters and a brother.  One sister had breast cancer.  The maternal grandparents are deceased.  The  grandfather had breast cancer.  The patient's father had prostate cancer at 54 and bladder cancer at 10.  He had six siblings who were cancer free. One niece had lymphoma.  His father had lung cancer.  Ms. Youngren is unaware of previous family history of genetic testing for hereditary cancer risks. There is no reported Ashkenazi Jewish ancestry. There is no known consanguinity.  GENETIC COUNSELING ASSESSMENT: Ms. Hoffert is a 52 y.o. female with a family history of breast cancer which is somewhat suggestive of a hereditary cancer syndrome and predisposition to cancer given the young age of onset of breast cancer on the paternal side and the maternal grandfather with breast cancer. We, therefore, discussed and recommended the following at today's visit.   DISCUSSION: We discussed that, in general, most cancer is not inherited in families, but instead is sporadic or familial. Sporadic cancers occur by chance and typically happen at older ages (>50 years) as this type of cancer is caused by genetic changes acquired during an individual's lifetime. Some families have more cancers than would be expected by chance; however, the ages or types of cancer are not consistent with a known genetic mutation or known genetic mutations have been ruled out. This type of familial cancer is thought to be due to a combination of multiple genetic, environmental, hormonal, and lifestyle factors. While this combination of factors likely increases the risk of cancer, the exact source of this risk is not currently identifiable or testable.  We discussed that 5 - 10% of breast cancer is hereditary, with most cases associated with BRCA mutations.  There are other genes that can be associated with hereditary breast cancer syndromes.  These include ATM, CHEK2 and PALB2.  Based on the young age of onset of breast cancer in her paternal half sister and her father's prostate cancer, as well as the maternal grandfather's diagnosis  of breast cancer and maternal aunt as well, Ms. Riva has a bilineal risk for breast cancer.  We discussed that testing is beneficial for several reasons including knowing how to follow individuals after completing their treatment,and understanding if other family members could be at risk for cancer and allow them to undergo genetic testing.   We reviewed the characteristics, features and inheritance patterns of hereditary cancer syndromes. We also discussed genetic testing, including the appropriate family members to test, the process of testing, insurance coverage and turn-around-time for results. We discussed the implications of a negative, positive, carrier and/or variant of uncertain significant result.  Ms. Kernes  was offered a common hereditary cancer panel (36+ genes) and an expanded pan-cancer panel (70+ genes). Ms. Toda was informed of the benefits and limitations of each panel, including that expanded pan-cancer panels contain genes that do not have clear management guidelines at this point in time.  We also discussed that as the number of genes included on a panel increases, the chances of variants of uncertain significance increases. Ms. Krabill decided to pursue genetic testing for the CustomNext (+leukemia/lymphoma)+RNA gene panel.   Based on Ms. Ivy's family history of cancer, she meets medical criteria for genetic testing. Though Ms. Wartman is not personally affected, there are no affected family members that are willing/able/available to undergo hereditary cancer testing on the maternal side.  Therefore, Ms. Montgomeryis the most informative family member available.  Despite that she meets criteria, she may still have an out of pocket cost. We discussed that if her out of pocket cost for testing is over $100, the laboratory will call and confirm whether she wants to proceed with testing.  If the out of pocket cost of testing is less than $100 she will be billed by  the genetic testing laboratory.   We discussed that some people do not want to undergo genetic testing due to fear of genetic discrimination.  The Genetic Information Nondiscrimination Act (GINA) was signed into federal law in 2008. GINA prohibits health insurers and most employers from discriminating against individuals based on genetic information (including the results of genetic tests and family history information). According to GINA, health insurance companies cannot consider genetic information to be a preexisting condition, nor can they use it to make decisions regarding coverage or rates. GINA also makes it illegal for most employers to use genetic information in making decisions about hiring, firing, promotion, or terms of employment. It is important to note that GINA does not offer protections for life insurance, disability insurance, or long-term care insurance. GINA does not apply to those in the [employer], those who work for companies with less than 15 employees, and new life insurance or long-term disability insurance policies.  Health status due to a cancer diagnosis is not protected under GINA. More information about GINA can be found by visiting [email or web address].  The Tyrer-Cuzick model is one of multiple prediction models developed to estimate an individual's lifetime risk of developing breast cancer. The Tyrer-Cuzick model is endorsed by the [employer] (NCCN). This model includes many risk factors such as family history, endogenous estrogen exposure, and benign breast disease. The calculation is highly-dependent on the accuracy of clinical data provided by the patient and can change over time. The Tyrer-Cuzick model may be repeated to reflect new information in her personal or family history in the future.   Ms. Grimm has been determined to be at high risk for breast cancer.  her Tyrer-Cuzick risk score is 30.3%.  For women with a greater than 20% lifetime  risk of breast cancer, the [employer] (NCCN) recommends the following:  1.      Clinical encounter every 6-12 months to begin when identified as being at increased risk, but not before age 74  2.      Annual mammograms. Tomosynthesis is recommended starting 10 years earlier than the youngest breast cancer diagnosis in the family or at age 25 (whichever comes first), but not before age 44   60.      Annual breast MRI starting 10 years earlier than the youngest breast cancer diagnosis  in the family or at age 73 (whichever comes first), but not before age 69.      PLAN: After considering the risks, benefits, and limitations, Ms. Nowling provided informed consent to pursue genetic testing and the blood sample was sent to [facility] for analysis of the CustomNext (+Leukemia/lymphoma)+RNAinsight. Results should be available within approximately 2-3 weeks' time, at which point they will be disclosed by telephone to Ms. Zuniga, as will any additional recommendations warranted by these results. Ms. Dazey will receive a summary of her genetic counseling visit and a copy of her results once available. This information will also be available in Epic.   Lastly, we encouraged Ms. Bosques to remain in contact with cancer genetics annually so that we can continuously update the family history and inform her of any changes in cancer genetics and testing that may be of benefit for this family.   Ms. Bey's questions were answered to her satisfaction today. Our contact information was provided should additional questions or concerns arise. Thank you for the referral and allowing [place] to share in the care of your patient.   Olamide Carattini P. Lowell Guitar, MS, CGC Licensed, Patent attorney [email or web address] phone: [phone]  80 minutes were spent on the date of the encounter in service to the patient including preparation, face-to-face  consultation, documentation and care coordination.  The patient was seen alone.  Drs. Meliton Rattan, and/or [place] were available for questions, if needed..    _______________________________________________________________________ For Office Staff:  Number of people involved in session: 1 Was an Intern/ student involved with case: no

## 2023-04-08 ENCOUNTER — Other Ambulatory Visit: Payer: Self-pay

## 2023-04-09 DIAGNOSIS — Z1231 Encounter for screening mammogram for malignant neoplasm of breast: Secondary | ICD-10-CM

## 2023-04-09 NOTE — Progress Notes (Deleted)
 Ellouise Console, PA-C [address]  [address]  [place], [place] [address]  Main: [phone]  Fax: [phone]   Gastroenterology Consultation  Referring Provider:     Stephanie Freund, MD Primary Care Physician:  Stephanie Freund, MD Primary Gastroenterologist:  *** Reason for Consultation:     Stomach pain        HPI:   Yolanda Keller is a 53 y.o. y/o female referred for consultation & management  by Stephanie Freund, MD.    First screening colonoscopy done 07/2022 by Dr. Stacia at [facility] showed 2 small hyperplastic polyps removed.  Good prep.  10-year repeat.  No previous EGD.  Past Medical History:  Diagnosis Date   Allergy    SEASONAL   Anemia    Anxiety    Arthritis    Endometriosis    Family history of bladder cancer    Family history of breast cancer    Family history of prostate cancer    Hearing loss of both ears    Hyperlipidemia    Menopause    Pre-diabetes    Seasonal depression (HCC)    Vertigo     Past Surgical History:  Procedure Laterality Date   CERVICAL BIOPSY  W/ LOOP ELECTRODE EXCISION     SALPINGECTOMY Left     Prior to Admission medications   Medication Sig Start Date End Date Taking? Authorizing Provider  chlorhexidine (PERIDEX) 0.12 % solution  03/19/23   [provider]  Cholecalciferol (VITAMIN D -3 PO) Take by mouth. TAKE ONE DAILY    [provider]  Cyanocobalamin  (VITAMIN B 12 PO) Take by mouth. TAKE ONE DAILY    [provider]  Ferrous Sulfate (IRON PO) Take by mouth daily. TAKE ONE DAILY    [provider]  Flaxseed, Linseed, (FLAX SEEDS PO) Take by mouth daily. ONE DAILY    [provider]  fluticasone (FLONASE) 50 MCG/ACT nasal spray Place into both nostrils daily.    [provider]  MAGNESIUM PO Take by mouth. TAKE ONE DAILY    [provider]  OVER THE COUNTER MEDICATION daily. [facility] URSI TAKE 2 DAILY    [provider]  OVER THE COUNTER  MEDICATION daily. ESTO PAUSE ONE DAILY    [provider]  penicillin v potassium (VEETID) 500 MG tablet Take by mouth. 02/03/23   [provider]    Family History  Problem Relation Age of Onset   Hypertension Mother    Prostate cancer Father 93   Bladder Cancer Father 65   Diabetes Maternal Aunt    Breast cancer Maternal Aunt    Breast cancer Maternal Grandfather    Breast cancer Half-Sister 70   Colon polyps Half-Sister    Lymphoma Cousin 19       pat first cousin   Colon cancer Neg Hx    Crohn's disease Neg Hx    Esophageal cancer Neg Hx    Rectal cancer Neg Hx    Stomach cancer Neg Hx    Ulcerative colitis Neg Hx      Social History   Tobacco Use   Smoking status: Some Days    Types: Cigars   Smokeless tobacco: Never  Vaping Use   Vaping status: Never Used  Substance Use Topics   Alcohol use: Not Currently    Comment: social   Drug use: No    Allergies as of 04/10/2023   (No Known Allergies)    Review of Systems:  All systems reviewed and negative except where noted in HPI.   Physical Exam:  LMP 09/01/2017 (Approximate)  Patient's last menstrual period was 09/01/2017 (approximate).  General:   Alert,  Well-developed, well-nourished, pleasant and cooperative in NAD Lungs:  Respirations even and unlabored.  Clear throughout to auscultation.   No wheezes, crackles, or rhonchi. No acute distress. Heart:  Regular rate and rhythm; no murmurs, clicks, rubs, or gallops. Abdomen:  Normal bowel sounds.  No bruits.  Soft, and non-distended without masses, hepatosplenomegaly or hernias noted.  No Tenderness.  No guarding or rebound tenderness.    Neurologic:  Alert and oriented x3;  grossly normal neurologically. Psych:  Alert and cooperative. Normal mood and affect.  Imaging Studies: No results found.  Assessment and Plan:   Yolanda Keller is a 53 y.o. y/o female has been referred for ***  Follow up ***  Ellouise Console, PA-C    BP  check ***

## 2023-04-10 ENCOUNTER — Ambulatory Visit: Payer: Medicaid Other | Admitting: Physician Assistant

## 2023-04-14 ENCOUNTER — Telehealth: Payer: Self-pay | Admitting: Gastroenterology

## 2023-04-14 NOTE — Telephone Encounter (Signed)
 Patient called stated she has had abdominal pain that she has never felt before. Seeking further advise.

## 2023-04-14 NOTE — Telephone Encounter (Signed)
 The pt has only ever been seen for colonoscopy May of 2024.  She states she has an abd discomfort that is generalized that comes and goes. She has not felt this before.  She is does not think that there is any correlation in regards to eating.  She has been set up for appt with Dr Cherryl Corona on 4/3 and she is also going to call her PCP in the meantime for eval.  She will let us  know if the discomfort  worsens or she develops any further complaints.

## 2023-04-24 ENCOUNTER — Telehealth: Payer: Self-pay | Admitting: Genetic Counselor

## 2023-04-24 ENCOUNTER — Ambulatory Visit: Payer: Self-pay | Admitting: Genetic Counselor

## 2023-04-24 ENCOUNTER — Encounter: Payer: Self-pay | Admitting: Genetic Counselor

## 2023-04-24 DIAGNOSIS — Z1379 Encounter for other screening for genetic and chromosomal anomalies: Secondary | ICD-10-CM | POA: Insufficient documentation

## 2023-04-24 NOTE — Telephone Encounter (Signed)
Revealed negative genetic testing.  Discussed that we do not know why there is cancer in the family. It could be due to a different gene that we are not testing, or maybe our current technology may not be able to pick something up.  It will be important for her to keep in contact with genetics to keep up with whether additional testing may be needed.  

## 2023-04-24 NOTE — Progress Notes (Signed)
HPI:  Yolanda Keller was previously seen in the [facility] due to a family history of breast cancer and concerns regarding a hereditary predisposition to cancer. Please refer to our prior cancer genetics clinic note for more information regarding our discussion, assessment and recommendations, at the time. Yolanda Keller's recent genetic test results were disclosed to her, as were recommendations warranted by these results. These results and recommendations are discussed in more detail below.  CANCER HISTORY:  Oncology History   No history exists.    FAMILY HISTORY:  We obtained a detailed, 4-generation family history.  Significant diagnoses are listed below: Family History  Problem Relation Age of Onset   Hypertension Mother    Prostate cancer Father 48   Bladder Cancer Father 103   Diabetes Maternal Aunt    Breast cancer Maternal Aunt    Breast cancer Maternal Grandfather    Breast cancer Half-Sister 31   Colon polyps Half-Sister    Lymphoma Cousin 4       pat first cousin   Colon cancer Neg Hx    Crohn's disease Neg Hx    Esophageal cancer Neg Hx    Rectal cancer Neg Hx    Stomach cancer Neg Hx    Ulcerative colitis Neg Hx        The patient does not have children. She has a maternal half sister who is cancer free and a paternal half sister and brother.  The paternal half sister was diagnosed with breast cancer at 9.  The patient's parents are living.   The patient's mother does not have cancer.  She has three sisters and a brother.  One sister had breast cancer.  The maternal grandparents are deceased.  The grandfather had breast cancer.   The patient's father had prostate cancer at 38 and bladder cancer at 25.  He had six siblings who were cancer free. One niece had lymphoma.  His father had lung cancer.   Yolanda Keller is unaware of previous family history of genetic testing for hereditary cancer risks. There is no reported Ashkenazi Jewish  ancestry. There is no known consanguinity.  GENETIC TEST RESULTS: Genetic testing reported out on April 15, 2023 through the CancerNext-Expanded+RNAinsight cancer panel found no pathogenic mutations. The CancerNext-Expanded gene panel offered by [facility] and includes sequencing, rearrangement, and RNA analysis for the following 76 genes: AIP, ALK, APC, ATM, AXIN2, BAP1, BARD1, BMPR1A, BRCA1, BRCA2, BRIP1, CDC73, CDH1, CDK4, CDKN1B, CDKN2A, CEBPA, CHEK2, CTNNA1, DDX41, DICER1, ETV6, FH, FLCN, GATA2, LZTR1, MAX, MBD4, MEN1, MET, MLH1, MSH2, MSH3, MSH6, MUTYH, NF1, NF2, NTHL1, PALB2, PHOX2B, PMS2, POT1, PRKAR1A, PTCH1, PTEN, RAD51C, RAD51D, RB1, RET, RUNX1, SDHA, SDHAF2, SDHB, SDHC, SDHD, SMAD4, SMARCA4, SMARCB1, SMARCE1, STK11, SUFU, TMEM127, TP53, TSC1, TSC2, VHL, and WT1 (sequencing and deletion/duplication); EGFR, HOXB13, KIT, MITF, PDGFRA, POLD1, and POLE (sequencing only); EPCAM and GREM1 (deletion/duplication only). The test report has been scanned into EPIC and is located under the Molecular Pathology section of the Results Review tab.  A portion of the result report is included below for reference.     We discussed with Yolanda Keller that because current genetic testing is not perfect, it is possible there may be a gene mutation in one of these genes that current testing cannot detect, but that chance is small.  We also discussed, that there could be another gene that has not yet been discovered, or that we have not yet tested, that is responsible for the cancer diagnoses in the  family. It is also possible there is a hereditary cause for the cancer in the family that Yolanda Keller did not Keller and therefore was not identified in her testing.  Therefore, it is important to remain in touch with cancer genetics in the future so that we can continue to offer Yolanda Keller the most up to date genetic testing.   ADDITIONAL GENETIC TESTING: We discussed with Yolanda Keller that her genetic  testing was fairly extensive.  If there are genes identified to increase cancer risk that can be analyzed in the future, we would be happy to discuss and coordinate this testing at that time.    CANCER SCREENING RECOMMENDATIONS: Yolanda Keller test result is considered negative (normal).  This means that we have not identified a hereditary cause for her family history of cancer at this time. Most cancers happen by chance and this negative test suggests that her family history of breast cancer may fall into this category.    Possible reasons for Yolanda Keller negative genetic test include:  1. There may be a gene mutation in one of these genes that current testing methods cannot detect but that chance is small.  2. There could be another gene that has not yet been discovered, or that we have not yet tested, that is responsible for the cancer diagnoses in the family.  3.  There may be no hereditary risk for cancer in the family. The cancers in Yolanda Keller and/or her family may be sporadic/familial or due to other genetic and environmental factors. 4. It is also possible there is a hereditary cause for the cancer in the family that Yolanda Keller.  Therefore, it is recommended she continue to follow the cancer management and screening guidelines provided by her primary healthcare provider. An individual's cancer risk and medical management are not determined by genetic test results alone. Overall cancer risk assessment incorporates additional factors, including personal medical history, family history, and any available genetic information that may result in a personalized plan for cancer prevention and surveillance  The Tyrer-Cuzick model is one of multiple prediction models developed to estimate an individual's lifetime risk of developing breast cancer. The Tyrer-Cuzick model is endorsed by the [employer] (NCCN). This model includes many risk factors such  as family history, endogenous estrogen exposure, and benign breast disease. The calculation is highly-dependent on the accuracy of clinical data provided by the patient and can change over time. The Tyrer-Cuzick model may be repeated to reflect new information in her personal or family history in the future.    Yolanda Keller has been determined to be at high risk for breast cancer. her Tyrer-Cuzick risk score is 30.3%.  For women with a greater than 20% lifetime risk of breast cancer, the [employer] (NCCN) recommends the following:   1.      Clinical encounter every 6-12 months to begin when identified as being at increased risk, but not before age 79  2.      Annual mammograms. Tomosynthesis is recommended starting 10 years earlier than the youngest breast cancer diagnosis in the family or at age 40 (whichever comes first), but not before age 64    75.      Annual breast MRI starting 10 years earlier than the youngest breast cancer diagnosis in the family or at age 42 (whichever comes first), but not before age 60.   We discussed that it is reasonable for Yolanda Keller to be followed  by a high-risk breast cancer clinic; in addition to a yearly mammogram and physical exam by a healthcare provider, she should discuss the usefulness of an annual breast MRI with the high-risk clinic providers.  We have made a referral to the [facility].      RECOMMENDATIONS FOR FAMILY MEMBERS:   Since she did not Keller a identifiable mutation in a cancer predisposition gene included on this panel, her children could not have inherited a known mutation from her in one of these genes. Individuals in this family might be at some increased risk of developing cancer, over the general population risk, simply due to the family history of cancer.  We recommended women in this family have a yearly mammogram beginning at age 65, or 65 years younger than the earliest onset of  cancer, an annual clinical breast exam, and perform monthly breast self-exams. Women in this family should also have a gynecological exam as recommended by their primary provider. All family members should be referred for colonoscopy starting at age 81, or 49 years younger than the earliest onset of cancer. It is also possible there is a hereditary cause for the cancer in Yolanda Keller family that she did not Keller and therefore was not identified in her.  Based on Yolanda Keller family history, we recommended her paternal half sister, who was diagnosed with breast cancer at age 105, have genetic counseling and testing. Yolanda Keller will let us know if we can be of any assistance in coordinating genetic counseling and/or testing for this family member.   FOLLOW-UP: Lastly, we discussed with Yolanda Keller that cancer genetics is a rapidly advancing field and it is possible that new genetic tests will be appropriate for her and/or her family members in the future. We encouraged her to remain in contact with cancer genetics on an annual basis so we can update her personal and family histories and let her know of advances in cancer genetics that may benefit this family.   Our contact number was provided. Ms. Lobello's questions were answered to her satisfaction, and she knows she is welcome to call us at anytime with additional questions or concerns.   Maylon Cos, MS, [facility] Licensed, Certified Genetic Counselor [email or web address]

## 2023-04-24 NOTE — Telephone Encounter (Signed)
Left VM message stating that her results are in.  Asked that she please CB.  Left CB instructions.

## 2023-05-07 ENCOUNTER — Other Ambulatory Visit: Payer: Self-pay | Admitting: Nurse Practitioner

## 2023-05-07 DIAGNOSIS — E559 Vitamin D deficiency, unspecified: Secondary | ICD-10-CM

## 2023-05-07 NOTE — Telephone Encounter (Signed)
 Med refill request: Vitamin D (50000 units) Last OV: 03/04/23, patient was to take 2000 Vitamin D otc Last AEX: 05/23/22 Next AEX: none scheduled Last MMG (if hormonal med) n/a Refill denied, refill not appropriate.  Sent to provider for review.

## 2023-05-12 ENCOUNTER — Ambulatory Visit
Admission: RE | Admit: 2023-05-12 | Discharge: 2023-05-12 | Disposition: A | Discharge status: Home / Self Care | Payer: Medicaid Other | Source: Ambulatory Visit | Attending: Nurse Practitioner

## 2023-05-12 DIAGNOSIS — Z0000 Encounter for general adult medical examination without abnormal findings: Secondary | ICD-10-CM

## 2023-05-12 DIAGNOSIS — Z1231 Encounter for screening mammogram for malignant neoplasm of breast: Secondary | ICD-10-CM | POA: Diagnosis not present

## 2023-06-05 ENCOUNTER — Encounter: Payer: Self-pay | Admitting: Nurse Practitioner

## 2023-06-05 ENCOUNTER — Other Ambulatory Visit (INDEPENDENT_AMBULATORY_CARE_PROVIDER_SITE_OTHER)

## 2023-06-05 ENCOUNTER — Ambulatory Visit: Payer: Medicaid Other | Admitting: Gastroenterology

## 2023-06-05 ENCOUNTER — Encounter: Payer: Self-pay | Admitting: Gastroenterology

## 2023-06-05 VITALS — BP 118/76 | HR 78 | Ht 69.0 in | Wt 167.0 lb

## 2023-06-05 DIAGNOSIS — Z09 Encounter for follow-up examination after completed treatment for conditions other than malignant neoplasm: Secondary | ICD-10-CM

## 2023-06-05 DIAGNOSIS — R1013 Epigastric pain: Secondary | ICD-10-CM

## 2023-06-05 LAB — COMPREHENSIVE METABOLIC PANEL WITH GFR
ALT: 30 U/L (ref 0–35)
AST: 21 U/L (ref 0–37)
Albumin: 4.9 g/dL (ref 3.5–5.2)
Alkaline Phosphatase: 54 U/L (ref 39–117)
BUN: 15 mg/dL (ref 6–23)
CO2: 28 meq/L (ref 19–32)
Calcium: 9.9 mg/dL (ref 8.4–10.5)
Chloride: 104 meq/L (ref 96–112)
Creatinine, Ser: 0.69 mg/dL (ref 0.40–1.20)
GFR: 99.58 mL/min (ref >60.00)
Glucose, Bld: 91 mg/dL (ref 70–99)
Potassium: 4.9 meq/L (ref 3.5–5.1)
Sodium: 142 meq/L (ref 135–145)
Total Bilirubin: 0.4 mg/dL (ref 0.2–1.2)
Total Protein: 8 g/dL (ref 6.0–8.3)

## 2023-06-05 LAB — LIPASE: Lipase: 31 U/L (ref 11.0–59.0)

## 2023-06-05 NOTE — Progress Notes (Signed)
 Discussed the use of AI scribe software for clinical note transcription with the patient, who gave verbal consent to proceed.  HPI : Yolanda Keller "Key" is a 53 year old female who presents with abdominal pain which has resolved.  She was experiencing abdominal pain in January, which began during a period of significant Tylenol use for wisdom tooth pain. The pain is described as a sharp, pressure-like sensation in the upper abdomen, initially lasting about a week, with a recurrence two weeks later that was less severe and lasted a couple of days. During the initial episode, she had a constant need to burp but was unable to do so, experienced mild nausea, but no vomiting. Her appetite remained good, and there were no significant changes in bowel movements, fevers, or chills.  She was taking approximately 4 to 5 grams of Tylenol daily for about two months due to severe tooth pain, which was eventually resolved with the extraction of three wisdom teeth.   When she developed the abdominal pain, she thought it may have been from excessive Tylenol, so she stopped.  She denied any yellowing of the eyes, dark urine or mental status changes. She took Ibuprofen initially for her tooth pain, but switched to Tylenol because ibuprofen wasn't helping. She does not consume alcohol daily, averaging about two to three glasses of wine per week.  There is a family history of stomach ulcers, with her grandmother and husband having experienced them.     No family history of gallstones or GI malignancy.  She had a colonoscopy last May in which 2 hyperplastic polyps were removed.      Colonoscopy May 2024 Indication: Initial average risk screening 2 polyps in the sigmoid and descending colon, 3-5 mm, both hyperplastic Otherwise normal Recommended repeat colonoscopy 10 years  Past Medical History:  Diagnosis Date   Allergy    SEASONAL   Anemia    Anxiety    Arthritis    Endometriosis    Family  history of bladder cancer    Family history of breast cancer    Family history of prostate cancer    Hearing loss of both ears    Hyperlipidemia    Menopause    Pre-diabetes    Seasonal depression (HCC)    Vertigo      Past Surgical History:  Procedure Laterality Date   CERVICAL BIOPSY  W/ LOOP ELECTRODE EXCISION     SALPINGECTOMY Left    Family History  Problem Relation Age of Onset   Hypertension Mother    Prostate cancer Father 73   Bladder Cancer Father 61   Diabetes Maternal Aunt    Breast cancer Maternal Aunt    Breast cancer Maternal Grandfather    Breast cancer Half-Sister 36   Colon polyps Half-Sister    Lymphoma Cousin 70       pat first cousin   Colon cancer Neg Hx    Crohn's disease Neg Hx    Esophageal cancer Neg Hx    Rectal cancer Neg Hx    Stomach cancer Neg Hx    Ulcerative colitis Neg Hx    Social History   Tobacco Use   Smoking status: Some Days    Types: Cigars   Smokeless tobacco: Never  Vaping Use   Vaping status: Never Used  Substance Use Topics   Alcohol use: Not Currently    Comment: social   Drug use: No   Current Outpatient Medications  Medication Sig Dispense Refill  chlorhexidine (PERIDEX) 0.12 % solution      Cholecalciferol (VITAMIN D-3 PO) Take by mouth. TAKE ONE DAILY     Cyanocobalamin (VITAMIN B 12 PO) Take by mouth. TAKE ONE DAILY     Ferrous Sulfate (IRON PO) Take by mouth daily. TAKE ONE DAILY     Flaxseed, Linseed, (FLAX SEEDS PO) Take by mouth daily. ONE DAILY     fluticasone (FLONASE) 50 MCG/ACT nasal spray Place into both nostrils daily.     MAGNESIUM PO Take by mouth. TAKE ONE DAILY     OVER THE COUNTER MEDICATION daily. UVA URSI TAKE 2 DAILY     OVER THE COUNTER MEDICATION daily. ESTO PAUSE ONE DAILY     penicillin v potassium (VEETID) 500 MG tablet Take by mouth.     No current facility-administered medications for this visit.   No Known Allergies   Review of Systems: All systems reviewed and negative  except where noted in HPI.    MM 3D SCREENING MAMMOGRAM BILATERAL BREAST Result Date: 05/15/2023 CLINICAL DATA:  Screening. EXAM: DIGITAL SCREENING BILATERAL MAMMOGRAM WITH TOMOSYNTHESIS AND CAD TECHNIQUE: Bilateral screening digital craniocaudal and mediolateral oblique mammograms were obtained. Bilateral screening digital breast tomosynthesis was performed. The images were evaluated with computer-aided detection. COMPARISON:  Previous exam(s). ACR Breast Density Category c: The breasts are heterogeneously dense, which may obscure small masses. FINDINGS: There are no findings suspicious for malignancy. IMPRESSION: No mammographic evidence of malignancy. A result letter of this screening mammogram will be mailed directly to the patient. RECOMMENDATION: Screening mammogram in one year. (Code:SM-B-01Y) BI-RADS CATEGORY  1: Negative. Electronically Signed   By: Emmaline Kluver M.D.   On: 05/15/2023 12:05    Physical Exam: BP 118/76   Pulse 78   Ht 5\' 9"  (1.753 m)   Wt 167 lb (75.8 kg)   LMP 09/01/2017 (Approximate)   BMI 24.66 kg/m  Constitutional: Pleasant,well-developed, African American female in no acute distress. HEENT: Normocephalic and atraumatic. Conjunctivae are normal. No scleral icterus. Neck supple.  Cardiovascular: Normal rate, regular rhythm.  Pulmonary/chest: Effort normal and breath sounds normal. No wheezing, rales or rhonchi. Abdominal: Soft, nondistended, nontender. Bowel sounds active throughout. There are no masses palpable. No hepatomegaly. Extremities: no edema Neurological: Alert and oriented to person place and time. Skin: Skin is warm and dry. No rashes noted. Psychiatric: Normal mood and affect. Behavior is normal.  CBC    Component Value Date/Time   WBC 8.4 02/04/2023 1431   RBC 5.20 (H) 02/04/2023 1431   HGB 14.3 02/04/2023 1431   HCT 43.0 02/04/2023 1431   PLT 271 02/04/2023 1431   MCV 82.7 02/04/2023 1431   MCH 27.5 02/04/2023 1431   MCHC 33.3  02/04/2023 1431   RDW 12.5 02/04/2023 1431   EOSABS 160 02/04/2023 1431   BASOSABS 50 02/04/2023 1431    CMP     Component Value Date/Time   NA 139 02/04/2023 1431   K 4.9 02/04/2023 1431   CL 104 02/04/2023 1431   CO2 25 02/04/2023 1431   GLUCOSE 89 02/04/2023 1431   BUN 15 02/04/2023 1431   CREATININE 0.62 02/04/2023 1431   CALCIUM 9.8 02/04/2023 1431   PROT 8.0 02/04/2023 1431   ALBUMIN 4.5 05/09/2015 1527   AST 22 02/04/2023 1431   ALT 31 (H) 02/04/2023 1431   ALKPHOS 44 05/09/2015 1527   BILITOT 0.3 02/04/2023 1431   GFRNONAA >89 05/09/2015 1527   GFRAA >89 05/09/2015 1527       Latest Ref  Rng & Units 02/04/2023    2:31 PM 02/14/2007    1:45 PM  CBC EXTENDED  WBC 3.8 - 10.8 Thousand/uL 8.4  9.9   RBC 3.80 - 5.10 Million/uL 5.20  3.41   Hemoglobin 11.7 - 15.5 g/dL 59.5  9.8   HCT 63.8 - 45.0 % 43.0  27.7   Platelets 140 - 400 Thousand/uL 271  233   NEUT# 1,500 - 7,800 cells/uL 3,973        ASSESSMENT AND PLAN:  53 year old female with transient abdominal pain lasting several days in January.  Pain has since resolved.  She has no history of chronic GI symptoms.  Abdominal Pain Intermittent sharp upper abdominal pain since January, associated with nausea but no vomiting or significant bowel changes. Pain is not exacerbated by eating. Differential diagnosis includes gallstones, peptic ulcer disease, and potential liver injury due to excessive acetaminophen use. Gallstones are less likely due to lack of correlation with eating. Peptic ulcer disease is possible given family history and symptoms.  Patient educated on the potential for serious liver injury with excessive Tylenol use.  - Order right upper quadrant ultrasound to evaluate for gallstones. - Order stool test for H. pylori to assess for peptic ulcer disease. - Order liver function tests to assess for potential liver injury.     If workup unremarkable, we will just continue to monitor for recurrence of pain.   Consider upper endoscopy if pain returns.  Sharlot Sturkey E. Tomasa Rand, MD [facility]  I spent a total of 35 minutes reviewing the patient's medical record, interviewing and examining the patient, discussing her diagnosis and management of her condition going forward, and documenting in the medical record    Morrie Sheldon, MD

## 2023-06-05 NOTE — Patient Instructions (Signed)
 You have been scheduled for an abdominal ultrasound at [facility] (1st floor of hospital) on 06/11/23 at 10:30am . Please arrive 30 minutes prior to your appointment for registration. Make certain not to have anything to eat or drink 6 hours prior to your appointment. Should you need to reschedule your appointment, please contact radiology at [phone]. This test typically takes about 30 minutes to perform.  Your provider has requested that you go to the basement level for lab work before leaving today. Press "B" on the elevator. The lab is located at the first door on the left as you exit the elevator.  _______________________________________________________  If your blood pressure at your visit was 140/90 or greater, please contact your primary care physician to follow up on this.  _______________________________________________________  If you are age 22 or older, your body mass index should be between 23-30. Your Body mass index is 24.66 kg/m. If this is out of the aforementioned range listed, please consider follow up with your Primary Care Provider.  If you are age 45 or younger, your body mass index should be between 19-25. Your Body mass index is 24.66 kg/m. If this is out of the aformentioned range listed, please consider follow up with your Primary Care Provider.   ________________________________________________________  The [facility] providers would like to encourage you to use [facility] to communicate with providers for non-urgent requests or questions.  Due to long hold times on the telephone, sending your provider a message by [facility] may be a faster and more efficient way to get a response.  Please allow 48 business hours for a response.  Please remember that this is for non-urgent requests.  _______________________________________________________   It was a pleasure to see you today!  Thank you for trusting me with your gastrointestinal care!

## 2023-06-07 LAB — H. PYLORI ANTIGEN, STOOL: H pylori Ag, Stl: NEGATIVE

## 2023-06-10 ENCOUNTER — Encounter: Payer: Self-pay | Admitting: Gastroenterology

## 2023-06-10 ENCOUNTER — Telehealth: Payer: Self-pay | Admitting: Gastroenterology

## 2023-06-10 NOTE — Telephone Encounter (Signed)
 Contacted patient and gave her phone number for radiology scheduling so she can find a better date/time for ultrasound.

## 2023-06-10 NOTE — Progress Notes (Signed)
 Key,  All of your labs looked great.  Liver enzymes were normal.  No evidence of pancreas inflammation.  Testing for H. pylori was negative. Will await results of ultrasound.

## 2023-06-10 NOTE — Telephone Encounter (Signed)
 Patient called stated she needs to reschedule the [place] appt for tomorrow.

## 2023-06-11 ENCOUNTER — Ambulatory Visit (HOSPITAL_COMMUNITY): Admission: RE | Admit: 2023-06-11 | Source: Ambulatory Visit

## 2023-06-16 ENCOUNTER — Encounter: Payer: Self-pay | Admitting: Nurse Practitioner

## 2023-06-16 ENCOUNTER — Ambulatory Visit: Admitting: Nurse Practitioner

## 2023-06-16 VITALS — BP 114/74 | Resp 18 | Ht 69.0 in | Wt 168.0 lb

## 2023-06-16 DIAGNOSIS — Z9189 Other specified personal risk factors, not elsewhere classified: Secondary | ICD-10-CM | POA: Diagnosis not present

## 2023-06-16 DIAGNOSIS — N951 Menopausal and female climacteric states: Secondary | ICD-10-CM | POA: Diagnosis not present

## 2023-06-16 MED ORDER — VEOZAH 45 MG PO TABS: 1.0000 | ORAL_TABLET | Freq: Every day | ORAL | 2 refills | Status: DC

## 2023-06-16 MED ORDER — VEOZAH 45 MG PO TABS
1.0000 | ORAL_TABLET | Freq: Every day | ORAL | 2 refills | Status: AC
Start: 2023-06-16 — End: ?

## 2023-06-16 NOTE — Progress Notes (Addendum)
   Acute Office Visit  Subjective:    Patient ID: Yolanda Keller, female    DOB: [birth year], 53 y.o.   MRN: [id number]   HPI 54 y.o. presents today to discuss HRT. Complains of night sweats, hot flashes, sleep disturbance, hair loss, fatigue and weight gain. Sometimes hot flashes can occur multiple times per hour. Postmenopausal since 2019. Symptoms are not new but are becoming intolerable. She has tried OTC options with no improvement. Half sister with history of breast cancer at 72, maternal aunt and MGF with history of breast cancer. Negative genetic screening in February. Tyrer-Cuzick risk score 30.3%.   Patient's last menstrual period was 09/01/2017 (approximate).    Review of Systems  Constitutional: Negative.   Endocrine: Positive for heat intolerance.  Psychiatric/Behavioral:  Positive for sleep disturbance.        Objective:    Physical Exam Constitutional:      Appearance: Normal appearance.   GU: Not indicated  BP 114/74 (BP Location: Left Arm, Patient Position: Sitting, Cuff Size: Normal)   Resp 18   Ht 5\' 9"  (1.753 m)   Wt 168 lb (76.2 kg)   LMP 09/01/2017 (Approximate)   BMI 24.81 kg/m  Wt Readings from Last 3 Encounters:  06/16/23 168 lb (76.2 kg)  06/05/23 167 lb (75.8 kg)  02/04/23 171 lb (77.6 kg)        Assessment & Plan:   Problem List Items Addressed This Visit       Other   Vasomotor symptoms due to menopause - Primary   Relevant Medications   Fezolinetant (VEOZAH) 45 MG TABS   Other Relevant Orders   Comprehensive metabolic panel with GFR   Other Visit Diagnoses       At high risk for breast cancer          Plan: Discussed negative genetic screenings and increased risk for breast cancer. Due to breast cancer risk, HRT not recommended. Discussed non-hormonal options. Interested in [place]. [facility] specialty pharmacy used. Normal LFTs 06/05/23. Return in 3 months to recheck LFTs. Recommend annual breast MRIs. Will schedule this in  September.   Return in about 3 months (around 09/15/2023) for labs only.    Andee Bamberger DNP, 2:07 PM 06/16/2023

## 2023-06-18 ENCOUNTER — Ambulatory Visit: Admitting: Nurse Practitioner

## 2023-06-20 ENCOUNTER — Ambulatory Visit (HOSPITAL_COMMUNITY)
Admission: RE | Admit: 2023-06-20 | Discharge: 2023-06-20 | Disposition: A | Discharge status: Home / Self Care | Source: Ambulatory Visit | Attending: Gastroenterology | Admitting: Gastroenterology

## 2023-06-20 ENCOUNTER — Other Ambulatory Visit (HOSPITAL_COMMUNITY)

## 2023-06-20 DIAGNOSIS — R1013 Epigastric pain: Secondary | ICD-10-CM | POA: Insufficient documentation

## 2023-06-24 DIAGNOSIS — Z803 Family history of malignant neoplasm of breast: Secondary | ICD-10-CM | POA: Diagnosis not present

## 2023-06-24 DIAGNOSIS — Z9189 Other specified personal risk factors, not elsewhere classified: Secondary | ICD-10-CM | POA: Diagnosis not present

## 2023-06-25 ENCOUNTER — Encounter: Payer: Self-pay | Admitting: Gastroenterology

## 2023-06-25 NOTE — Progress Notes (Signed)
 Ms. Boulay,  Your ultrasound was normal.  There was no evidence of gallstones. Please let us  know if you are having more problems with abdominal pain, and we can reconsider performing an upper endoscopy.

## 2023-07-01 DIAGNOSIS — R1013 Epigastric pain: Secondary | ICD-10-CM | POA: Diagnosis not present

## 2023-07-01 DIAGNOSIS — M546 Pain in thoracic spine: Secondary | ICD-10-CM | POA: Diagnosis not present

## 2023-07-02 NOTE — Telephone Encounter (Signed)
 I have spoken to patient to advise of Dr Milus Alpha response/recommendations. She unfortunately cannot come this morning to see Dr Cherryl Corona. Everett Hitt, NP had availability for tomorrow, 07/03/23 at 11 am and patient has agreed to come for this visit instead. Dr Cherryl Corona advised.

## 2023-07-03 ENCOUNTER — Ambulatory Visit (INDEPENDENT_AMBULATORY_CARE_PROVIDER_SITE_OTHER): Admitting: Nurse Practitioner

## 2023-07-03 ENCOUNTER — Other Ambulatory Visit

## 2023-07-03 ENCOUNTER — Encounter: Payer: Self-pay | Admitting: Nurse Practitioner

## 2023-07-03 VITALS — BP 116/64 | HR 86 | Ht 69.0 in | Wt 167.1 lb

## 2023-07-03 DIAGNOSIS — R1013 Epigastric pain: Secondary | ICD-10-CM

## 2023-07-03 DIAGNOSIS — M549 Dorsalgia, unspecified: Secondary | ICD-10-CM

## 2023-07-03 DIAGNOSIS — Z860102 Personal history of hyperplastic colon polyps: Secondary | ICD-10-CM

## 2023-07-03 DIAGNOSIS — R110 Nausea: Secondary | ICD-10-CM

## 2023-07-03 DIAGNOSIS — N951 Menopausal and female climacteric states: Secondary | ICD-10-CM | POA: Diagnosis not present

## 2023-07-03 LAB — COMPREHENSIVE METABOLIC PANEL WITH GFR
ALT: 32 U/L (ref 0–35)
AST: 21 U/L (ref 0–37)
Albumin: 5 g/dL (ref 3.5–5.2)
Alkaline Phosphatase: 64 U/L (ref 39–117)
BUN: 15 mg/dL (ref 6–23)
CO2: 24 meq/L (ref 19–32)
Calcium: 9.8 mg/dL (ref 8.4–10.5)
Chloride: 104 meq/L (ref 96–112)
Creatinine, Ser: 0.61 mg/dL (ref 0.40–1.20)
GFR: 102.53 mL/min (ref >60.00)
Glucose, Bld: 84 mg/dL (ref 70–99)
Potassium: 3.9 meq/L (ref 3.5–5.1)
Sodium: 139 meq/L (ref 135–145)
Total Bilirubin: 0.4 mg/dL (ref 0.2–1.2)
Total Protein: 8.5 g/dL — ABNORMAL HIGH (ref 6.0–8.3)

## 2023-07-03 LAB — CBC WITH DIFFERENTIAL/PLATELET
Basophils Absolute: 0.1 10*3/uL (ref 0.0–0.1)
Basophils Relative: 0.9 % (ref 0.0–3.0)
Eosinophils Absolute: 0.2 10*3/uL (ref 0.0–0.7)
Eosinophils Relative: 3 % (ref 0.0–5.0)
HCT: 43.8 % (ref 36.0–46.0)
Hemoglobin: 14.4 g/dL (ref 12.0–15.0)
Lymphocytes Relative: 37.7 % (ref 12.0–46.0)
Lymphs Abs: 2.9 10*3/uL (ref 0.7–4.0)
MCHC: 33 g/dL (ref 30.0–36.0)
MCV: 86.3 fl (ref 78.0–100.0)
Monocytes Absolute: 0.5 10*3/uL (ref 0.1–1.0)
Monocytes Relative: 6.6 % (ref 3.0–12.0)
Neutro Abs: 4 10*3/uL (ref 1.4–7.7)
Neutrophils Relative %: 51.8 % (ref 43.0–77.0)
Platelets: 238 10*3/uL (ref 150.0–400.0)
RBC: 5.07 Mil/uL (ref 3.87–5.11)
RDW: 13.6 % (ref 11.5–15.5)
WBC: 7.7 10*3/uL (ref 4.0–10.5)

## 2023-07-03 LAB — C-REACTIVE PROTEIN: CRP: <1 mg/dL (ref 0.5–20.0)

## 2023-07-03 LAB — LIPASE: Lipase: 22 U/L (ref 11.0–59.0)

## 2023-07-03 MED ORDER — HYOSCYAMINE SULFATE 0.125 MG SL SUBL: SUBLINGUAL_TABLET | SUBLINGUAL | 0 refills | Status: DC

## 2023-07-03 NOTE — Progress Notes (Signed)
 07/03/2023 Yolanda Keller [id number] 08/13/1970   Chief Complaint: Abdominal pain   History of Present Illness: Yolanda Keller is a 53 year old female with a past medical history of anxiety, depression, hyperlipidemia, prediabetes and hyperplastic colon polyps.  She is known by Dr. Cherryl Keller.  She presents today for further evaluation regarding constant epigastric pain which radiated through to the mid back and started abruptly on Saturday/20 07/2022.  She stated this pain came out of nowhere.  She ate salmon and a baked potato with crab and bacon on top 1 hour prior to the onset of her upper abdominal pain.  She is having difficulty sleeping at night due to this pain and sleeps either flat on her stomach or on her back.  She has some nausea without vomiting.  She worked in her garden a few days prior to the onset of her abdominal and back pain and initially thought her symptoms may be musculoskeletal.  She took a few doses of Ibuprofen 800mg   without relief. She went to [facility] urgent care 07/01/2023 due to having significant mid back pain and she was prescribed Flexeril and meloxicam  and was discharged home.  Grandmother with history of stomach ulcers.  She has chronic underlying epigastric pain which started 03/2023 for which she saw Dr. Cherryl Keller in office 06/05/2023. She described having sharp, pressure-like sensation in the upper abdomen, initially lasting about a week, with a recurrence two weeks later that was less severe and lasted a couple of days. During the initial episode, she had a constant need to burp but was unable to do so, experienced mild nausea, but no vomiting. At the time of her office visit 4/3, she stated her pain abated.  H. pylori stool antigen negative.  Normal LFTs.  A RUQ sonogram 06/20/2023 showed a normal liver, normal gallbladder without biliary ductal dilatation.  She typically passes a normal formed brown stool most days.  No bloody or black stools.   She underwent a colonoscopy 07/2022 which identified 2 hyperplastic's removed from the colon.     Latest Ref Rng & Units 02/04/2023    2:31 PM 02/14/2007    1:45 PM  CBC  WBC 3.8 - 10.8 Thousand/uL 8.4  9.9   Hemoglobin 11.7 - 15.5 g/dL 40.9  9.8   Hematocrit 35.0 - 45.0 % 43.0  27.7   Platelets 140 - 400 Thousand/uL 271  233        Latest Ref Rng & Units 06/05/2023    9:34 AM 02/04/2023    2:31 PM 05/09/2015    3:27 PM  CMP  Glucose 70 - 99 mg/dL 91  89  78   BUN 6 - 23 mg/dL 15  15  14    Creatinine 0.40 - 1.20 mg/dL 8.11  9.14  7.82   Sodium 135 - 145 mEq/L 142  139  135   Potassium 3.5 - 5.1 mEq/L 4.9  4.9  4.1   Chloride 96 - 112 mEq/L 104  104  101   CO2 19 - 32 mEq/L 28  25  23    Calcium 8.4 - 10.5 mg/dL 9.9  9.8  9.6   Total Protein 6.0 - 8.3 g/dL 8.0  8.0  7.6   Total Bilirubin 0.2 - 1.2 mg/dL 0.4  0.3  0.3   Alkaline Phos 39 - 117 U/L 54   44   AST 0 - 37 U/L 21  22  17    ALT 0 - 35 U/L 30  31  20      RUQ sonogram: 06/20/2023: FINDINGS: Gallbladder:   No gallstones or wall thickening visualized. No sonographic Murphy sign noted by sonographer.   Common bile duct:   Diameter: Visualized portion measures 3 mm, within normal limits.   Liver:   No focal lesion identified. Upper limits of normal in parenchymal echogenicity. Portal vein is patent on color Doppler imaging with normal direction of blood flow towards the liver.   Other: None.   IMPRESSION: No sonographic etiology for epigastric pain identified.  PAST GI PROCEDURES:   Colonoscopy May 2024 Indication: Initial average risk screening 2 polyps in the sigmoid and descending colon, 3-5 mm, both hyperplastic Otherwise normal Recommended repeat colonoscopy 10 years  Past Medical History:  Diagnosis Date   Allergy    SEASONAL   Anemia    Anxiety    Arthritis    Endometriosis    Family history of bladder cancer    Family history of breast cancer    Family history of prostate cancer    Hearing  loss of both ears    Hyperlipidemia    Menopause    Pre-diabetes    Seasonal depression (HCC)    Vertigo    Past Surgical History:  Procedure Laterality Date   CERVICAL BIOPSY  W/ LOOP ELECTRODE EXCISION     SALPINGECTOMY Left    Current Outpatient Medications on File Prior to Visit  Medication Sig Dispense Refill   Cholecalciferol (VITAMIN D -3 PO) Take by mouth. TAKE ONE DAILY     Cyanocobalamin  (VITAMIN B 12 PO) Take by mouth. TAKE ONE DAILY     Ferrous Sulfate (IRON PO) Take by mouth daily. TAKE ONE DAILY     Fezolinetant  (VEOZAH ) 45 MG TABS Take 1 tablet (45 mg total) by mouth daily at 6 (six) AM. 30 tablet 2   Flaxseed, Linseed, (FLAX SEEDS PO) Take by mouth daily. ONE DAILY     fluticasone (FLONASE) 50 MCG/ACT nasal spray Place into both nostrils daily.     ibuprofen (ADVIL) 800 MG tablet      MAGNESIUM PO Take by mouth. TAKE ONE DAILY     OVER THE COUNTER MEDICATION daily. [facility] URSI TAKE 2 DAILY     No current facility-administered medications on file prior to visit.   No Known Allergies  Current Medications, Allergies, Past Medical History, Past Surgical History, Family History and Social History were reviewed in [employer] record.  Review of Systems:   Constitutional: Negative for fever, sweats, chills or weight loss.  Respiratory: Negative for shortness of breath.   Cardiovascular: Negative for chest pain, palpitations and leg swelling.  Gastrointestinal: See HPI.  Musculoskeletal: Negative for back pain or muscle aches.  Neurological: Negative for dizziness, headaches or paresthesias.   Physical Exam: BP 116/64   Pulse 86   Ht 5\' 9"  (1.753 m)   Wt 167 lb 2 oz (75.8 kg)   LMP 09/01/2017 (Approximate)   BMI 24.68 kg/m   Wt Readings from Last 3 Encounters:  07/03/23 167 lb 2 oz (75.8 kg)  06/16/23 168 lb (76.2 kg)  06/05/23 167 lb (75.8 kg)    General: 53 year old female in no acute distress. Head: Normocephalic and  atraumatic. Eyes: No scleral icterus. Conjunctiva pink . Ears: Normal auditory acuity. Mouth: Dentition intact. No ulcers or lesions.  Lungs: Clear throughout to auscultation. Heart: Regular rate and rhythm, no murmur. Abdomen: Soft. Nondistended. Moderate epigastric tenderness without rebound or guarding. No masses or hepatomegaly. Normal bowel sounds  x 4 quadrants.  Rectal: Deferred.  Musculoskeletal: Symmetrical with no gross deformities. Extremities: No edema. Neurological: Alert oriented x 4. No focal deficits.  Psychological: Alert and cooperative. Normal mood and affect  Assessment and Recommendations:  53 year old female with epigastric pain which initially started Jan 2025 and abated by early April 2025 with new onset of abrupt severe epigastric pain which radiated through to the mid back on 06/27/2023 which has persisted since then.  Nausea without vomiting.  RUQ sonogram showed a normal gallbladder without biliary ductal dilatation. -CBC, CMP, CRP and lipase level -CTAP vs abdominal MRI/MRCP, await the above lab results to determine best image study -EGD benefits and risks discussed including risk with sedation, risk of bleeding, perforation and infection  - Hyoscyamine  0.125 mg 1 tab sublingual every 6 to 8 hours as needed - Pantoprazole 40 mg 1 capsule p.o. daily - No NSAID - Patient instructed to go to the ED if she develops severe abdominal/back or chest pain  History of colon polyps.  Colonoscopy 07/2022 showed 2 hyperplastic polyps removed from the sigmoid and descending colon. - Next colonoscopy due 07/2032

## 2023-07-03 NOTE — Patient Instructions (Addendum)
 You have been scheduled for an endoscopy. Please follow written instructions given to you at your visit today.  If you use inhalers (even only as needed), please bring them with you on the day of your procedure.  If you take any of the following medications, they will need to be adjusted prior to your procedure:   DO NOT TAKE 7 DAYS PRIOR TO TEST- Trulicity (dulaglutide) Ozempic, Wegovy (semaglutide) Mounjaro (tirzepatide) Bydureon Bcise (exanatide extended release)  DO NOT TAKE 1 DAY PRIOR TO YOUR TEST Rybelsus (semaglutide) Adlyxin (lixisenatide) Victoza (liraglutide) Byetta (exanatide) __________________________________________________________________________  Your provider has requested that you go to the basement level for lab work before leaving today. Press "B" on the elevator. The lab is located at the first door on the left as you exit the elevator.  Further abdominal imaging to be determined after lab results reviewed.  Go to the ED if you have severe abdominal pain.  Due to recent changes in healthcare laws, you may see the results of your imaging and laboratory studies on MyChart before your provider has had a chance to review them.  We understand that in some cases there may be results that are confusing or concerning to you. Not all laboratory results come back in the same time frame and the provider may be waiting for multiple results in order to interpret others.  Please give us  48 hours in order for your provider to thoroughly review all the results before contacting the office for clarification of your results.   Thank you for trusting me with your gastrointestinal care!   Everett Hitt, CRNP

## 2023-07-04 ENCOUNTER — Other Ambulatory Visit: Payer: Self-pay

## 2023-07-04 ENCOUNTER — Telehealth: Payer: Self-pay | Admitting: Nurse Practitioner

## 2023-07-04 ENCOUNTER — Encounter: Payer: Self-pay | Admitting: Nurse Practitioner

## 2023-07-04 DIAGNOSIS — M549 Dorsalgia, unspecified: Secondary | ICD-10-CM

## 2023-07-04 DIAGNOSIS — R1013 Epigastric pain: Secondary | ICD-10-CM

## 2023-07-04 LAB — COMPREHENSIVE METABOLIC PANEL WITH GFR
AG Ratio: 1.6 (calc) (ref 1.0–2.5)
ALT: 31 U/L — ABNORMAL HIGH (ref 6–29)
AST: 21 U/L (ref 10–35)
Albumin: 4.9 g/dL (ref 3.6–5.1)
Alkaline phosphatase (APISO): 62 U/L (ref 37–153)
BUN: 15 mg/dL (ref 7–25)
CO2: 24 mmol/L (ref 20–32)
Calcium: 9.9 mg/dL (ref 8.6–10.4)
Chloride: 105 mmol/L (ref 98–110)
Creat: 0.72 mg/dL (ref 0.50–1.03)
Globulin: 3 g/dL (ref 1.9–3.7)
Glucose, Bld: 76 mg/dL (ref 65–99)
Potassium: 4.2 mmol/L (ref 3.5–5.3)
Sodium: 140 mmol/L (ref 135–146)
Total Bilirubin: 0.4 mg/dL (ref 0.2–1.2)
Total Protein: 7.9 g/dL (ref 6.1–8.1)
eGFR: 101 mL/min/1.73m2

## 2023-07-04 MED ORDER — PANTOPRAZOLE SODIUM 40 MG PO TBEC: 40.0000 mg | DELAYED_RELEASE_TABLET | Freq: Every day | ORAL | 1 refills | Status: AC

## 2023-07-04 NOTE — Telephone Encounter (Signed)
 Linda/Dottie, I attempted to contact the patient at this time and left a message on her personal voicemail regarding her lab results which were normal, specifically LFTs and WBC counts were normal.  Please contact the patient and schedule her for a CTAP with oral and IV contrast as soon as possible to further evaluate significant epigastric pain which radiates through to the back.  MRI/MRCP deferred since her total bili/alk phos/AST/ALT and lipase levels are normal.  Patient is currently scheduled for EGD 08/05/2023, may need to expedite procedure date if symptoms worsen.

## 2023-07-04 NOTE — Telephone Encounter (Signed)
 Pt scheduled for ct of a/p at [facility] 07/07/23 arrival imte 9:13am for a 9:30am appt. Pt aware of appt.

## 2023-07-04 NOTE — Telephone Encounter (Signed)
 Arrival time 9:15 for a 9:30am appt.

## 2023-07-07 ENCOUNTER — Ambulatory Visit (HOSPITAL_COMMUNITY)
Admission: RE | Admit: 2023-07-07 | Discharge: 2023-07-07 | Disposition: A | Discharge status: Home / Self Care | Source: Ambulatory Visit | Attending: Nurse Practitioner | Admitting: Nurse Practitioner

## 2023-07-07 DIAGNOSIS — M549 Dorsalgia, unspecified: Secondary | ICD-10-CM | POA: Insufficient documentation

## 2023-07-07 DIAGNOSIS — R1013 Epigastric pain: Secondary | ICD-10-CM | POA: Diagnosis not present

## 2023-07-07 MED ORDER — IOHEXOL 300 MG/ML  SOLN
100.0000 mL | Freq: Once | INTRAMUSCULAR | Status: AC | PRN
  Administered 2023-07-07: 100 mL via INTRAVENOUS

## 2023-07-07 MED ORDER — SODIUM CHLORIDE (PF) 0.9 % IJ SOLN
INTRAMUSCULAR | Status: AC
  Filled 2023-07-07: qty 50

## 2023-07-18 NOTE — Progress Notes (Signed)
 Agree with the assessment and plan as outlined by Alcide Evener, NP.    Zae Kirtz E. Tomasa Rand, MD [facility]

## 2023-08-04 ENCOUNTER — Telehealth: Payer: Self-pay | Admitting: Nurse Practitioner

## 2023-08-04 NOTE — Telephone Encounter (Signed)
 Good afternoon Dr. Cherryl Corona, I received a call from this patient requesting to reschedule her EGD that is schedule for tomorrow at 2:30 due to her having a family emergency. Patient was reschedule for June the 17 th at 9:00 AM. Please advise.

## 2023-08-05 ENCOUNTER — Encounter: Admitting: Gastroenterology

## 2023-08-13 ENCOUNTER — Encounter: Payer: Self-pay | Admitting: *Deleted

## 2023-08-19 ENCOUNTER — Ambulatory Visit: Admitting: Gastroenterology

## 2023-08-19 ENCOUNTER — Encounter: Payer: Self-pay | Admitting: Gastroenterology

## 2023-08-19 DIAGNOSIS — K2940 Chronic atrophic gastritis without bleeding: Secondary | ICD-10-CM | POA: Diagnosis not present

## 2023-08-19 DIAGNOSIS — K3189 Other diseases of stomach and duodenum: Secondary | ICD-10-CM

## 2023-08-19 DIAGNOSIS — K2980 Duodenitis without bleeding: Secondary | ICD-10-CM | POA: Diagnosis not present

## 2023-08-19 DIAGNOSIS — R1013 Epigastric pain: Secondary | ICD-10-CM

## 2023-08-19 DIAGNOSIS — K319 Disease of stomach and duodenum, unspecified: Secondary | ICD-10-CM | POA: Diagnosis not present

## 2023-08-19 DIAGNOSIS — K2289 Other specified disease of esophagus: Secondary | ICD-10-CM | POA: Diagnosis not present

## 2023-08-19 MED ORDER — SODIUM CHLORIDE 0.9 % IV SOLN: 500.0000 mL | Freq: Once | INTRAVENOUS | Status: DC

## 2023-08-19 NOTE — Patient Instructions (Addendum)

## 2023-08-19 NOTE — Progress Notes (Signed)
 Sedate, gd SR, tolerated procedure well, VSS, report to RN

## 2023-08-19 NOTE — Progress Notes (Signed)
 Called to room to assist during endoscopic procedure.  Patient ID and intended procedure confirmed with present staff. Received instructions for my participation in the procedure from the performing physician.

## 2023-08-19 NOTE — Progress Notes (Signed)
 [facility] History and Physical   Primary Care Physician:  Maxie Spaniel, MD   Reason for Procedure:   Epigastric pain  Plan:    EGD     HPI: Yolanda Keller is a 53 y.o. female undergoing EGD to evaluate recurrent epigastric pain.  Negative H. Pylori stool antigen, normal liver enzymes/lipase, normal RUQUS and CT.  She has no family history of colon cancer and no chronic GI symptoms. She has not had any severe episodes in several weeks now.   Past Medical History:  Diagnosis Date   Allergy    SEASONAL   Anemia    Anxiety    Arthritis    Endometriosis    Family history of bladder cancer    Family history of breast cancer    Family history of prostate cancer    Hearing loss of both ears    Hyperlipidemia    Menopause    Pre-diabetes    Seasonal depression (HCC)    Vertigo     Past Surgical History:  Procedure Laterality Date   CERVICAL BIOPSY  W/ LOOP ELECTRODE EXCISION     SALPINGECTOMY Left     Prior to Admission medications   Medication Sig Start Date End Date Taking? Authorizing Provider  Cholecalciferol (VITAMIN D -3 PO) Take by mouth. TAKE ONE DAILY    [provider]  Cyanocobalamin  (VITAMIN B 12 PO) Take by mouth. TAKE ONE DAILY    [provider]  Ferrous Sulfate (IRON PO) Take by mouth daily. TAKE ONE DAILY    [provider]  Fezolinetant  (VEOZAH ) 45 MG TABS Take 1 tablet (45 mg total) by mouth daily at 6 (six) AM. 06/16/23   Lajuana Pilar, Tiffany A, NP  Flaxseed, Linseed, (FLAX SEEDS PO) Take by mouth daily. ONE DAILY    [provider]  fluticasone (FLONASE) 50 MCG/ACT nasal spray Place into both nostrils daily.    [provider]  hyoscyamine  (LEVSIN  SL) 0.125 MG SL tablet Take 1 by mouth sublingual every 6-8 hours as needed for abdominal pain. 07/03/23   Kennedy-Smith, Colleen M, NP  ibuprofen (ADVIL) 800 MG tablet  03/19/23   [provider]  MAGNESIUM PO Take by mouth. TAKE ONE DAILY     [provider]  OVER THE COUNTER MEDICATION daily. [facility] URSI TAKE 2 DAILY    [provider]  pantoprazole  (PROTONIX ) 40 MG tablet Take 1 tablet (40 mg total) by mouth daily. 07/04/23   Kennedy-Smith, Colleen M, NP    Current Outpatient Medications  Medication Sig Dispense Refill   Cholecalciferol (VITAMIN D -3 PO) Take by mouth. TAKE ONE DAILY     Cyanocobalamin  (VITAMIN B 12 PO) Take by mouth. TAKE ONE DAILY     Ferrous Sulfate (IRON PO) Take by mouth daily. TAKE ONE DAILY     Fezolinetant  (VEOZAH ) 45 MG TABS Take 1 tablet (45 mg total) by mouth daily at 6 (six) AM. 30 tablet 2   Flaxseed, Linseed, (FLAX SEEDS PO) Take by mouth daily. ONE DAILY     fluticasone (FLONASE) 50 MCG/ACT nasal spray Place into both nostrils daily.     hyoscyamine  (LEVSIN  SL) 0.125 MG SL tablet Take 1 by mouth sublingual every 6-8 hours as needed for abdominal pain. 30 tablet 0   ibuprofen (ADVIL) 800 MG tablet      MAGNESIUM PO Take by mouth. TAKE ONE DAILY     OVER THE COUNTER MEDICATION daily. [facility] URSI TAKE 2 DAILY     pantoprazole  (PROTONIX )  40 MG tablet Take 1 tablet (40 mg total) by mouth daily. 90 tablet 1   Current Facility-Administered Medications  Medication Dose Route Frequency Provider Last Rate Last Admin   0.9 %  sodium chloride  infusion  500 mL Intravenous Once Elois Hair, MD        Allergies as of 08/19/2023   (No Known Allergies)    Family History  Problem Relation Age of Onset   Hypertension Mother    Prostate cancer Father 77   Bladder Cancer Father 96   Diabetes Maternal Aunt    Breast cancer Maternal Aunt    Breast cancer Maternal Grandfather    Breast cancer Half-Sister 33   Colon polyps Half-Sister    Lymphoma Cousin 70       pat first cousin   Colon cancer Neg Hx    Crohn's disease Neg Hx    Esophageal cancer Neg Hx    Rectal cancer Neg Hx    Stomach cancer Neg Hx    Ulcerative colitis Neg Hx     Social History   Socioeconomic History    Marital status: Single    Spouse name: Not on file   Number of children: Not on file   Years of education: Not on file   Highest education level: Not on file  Occupational History   Not on file  Tobacco Use   Smoking status: Some Days    Types: Cigars   Smokeless tobacco: Never  Vaping Use   Vaping status: Never Used  Substance and Sexual Activity   Alcohol use: Not Currently    Comment: social   Drug use: No   Sexual activity: Not Currently    Partners: Male    Birth control/protection: Post-menopausal    Comment: First @ 57 y/o, 5 Partners, Hx of CT+  Other Topics Concern   Not on file  Social History Narrative   Not on file   Social Drivers of Health   Financial Resource Strain: Not on file  Food Insecurity: Not on file  Transportation Needs: Not on file  Physical Activity: Not on file  Stress: Not on file  Social Connections: Not on file  Intimate Partner Violence: Not on file    Review of Systems:  All other review of systems negative except as mentioned in the HPI.  Physical Exam: Vital signs BP 126/80   Pulse 92   Temp (!) 97.4 F (36.3 C) (Temporal)   Ht 5' 9 (1.753 m)   Wt 167 lb (75.8 kg)   LMP 09/01/2017 (Approximate)   SpO2 96%   BMI 24.66 kg/m   General:   Alert,  Well-developed, well-nourished, pleasant and cooperative in NAD Airway:  Mallampati 2 Lungs:  Clear throughout to auscultation.   Heart:  Regular rate and rhythm; no murmurs, clicks, rubs,  or gallops. Abdomen:  Soft, nontender and nondistended. Normal bowel sounds.   Neuro/Psych:  Normal mood and affect. A and O x 3   Jonai Weyland E. Cherryl Corona, MD [facility]

## 2023-08-19 NOTE — Op Note (Signed)
 [facility] Patient Name: Yolanda Keller Procedure Date: 08/19/2023 9:06 AM MRN: [id number] Endoscopist: Geralyn Knee E. Cherryl Corona , MD, [id number] Age: 53 Referring MD:  Date of Birth: [birth year] Gender: Female Account #: [id number] Procedure:                Upper GI endoscopy Indications:              Epigastric abdominal pain (recurrent, episodic; no                            recent episodes); Normal RUQUS/CT, CMP, lipase, CRP Medicines:                Monitored Anesthesia Care Procedure:                Pre-Anesthesia Assessment:                           - Prior to the procedure, a History and Physical                            was performed, and patient medications and                            allergies were reviewed. The patient's tolerance of                            previous anesthesia was also reviewed. The risks                            and benefits of the procedure and the sedation                            options and risks were discussed with the patient.                            All questions were answered, and informed consent                            was obtained. Prior Anticoagulants: The patient has                            taken no anticoagulant or antiplatelet agents. ASA                            Grade Assessment: III - A patient with severe                            systemic disease. After reviewing the risks and                            benefits, the patient was deemed in satisfactory                            condition to undergo the procedure.  After obtaining informed consent, the endoscope was                            passed under direct vision. Throughout the                            procedure, the patient's blood pressure, pulse, and                            oxygen saturations were monitored continuously. The                            GIF [id number] #[id number] was introduced through the                             mouth, and advanced to the second part of duodenum.                            The upper GI endoscopy was accomplished without                            difficulty. The patient tolerated the procedure                            well. Scope In: Scope Out: Findings:                 The examined portions of the nasopharynx,                            oropharynx and larynx were normal.                           Diffuse mild mucosal changes characterized by                            granularity and a decreased vascular pattern were                            found in the middle third of the esophagus and in                            the lower third of the esophagus. Biopsies were                            taken with a cold forceps for histology. Estimated                            blood loss was minimal.                           The exam of the esophagus was otherwise normal.                           Bilious fluid was found  in the gastric body.                           Diffuse atrophic mucosa was found in the gastric                            antrum. Biopsies were taken with a cold forceps for                            Helicobacter pylori testing. Estimated blood loss                            was minimal.                           Diffuse mildly congested mucosa was found in the                            gastric body. Biopsies were taken with a cold                            forceps for Helicobacter pylori testing. Estimated                            blood loss was minimal.                           Scattered mild inflammation characterized by                            erythema and nodularity was found in the duodenal                            bulb and in the second portion of the duodenum.                            Biopsies were taken with a cold forceps for                            histology. Estimated blood loss was minimal. Complications:            No  immediate complications. Estimated Blood Loss:     Estimated blood loss was minimal. Impression:               - The examined portions of the nasopharynx,                            oropharynx and larynx were normal.                           - Granular, decreased vascular pattern mucosa in                            the esophagus. Possibly suggestive of mild reflux  esophagitis. Biopsied.                           - Bilious gastric fluid.                           - Gastric mucosal atrophy. Biopsied.                           - Congestive gastropathy. Biopsied.                           - Duodenitis. Biopsied.                           - Unclear if any of these mild mucosal                            abnormalities explain her symptoms, but presence of                            bile in stomach and mild diffuse inflammatory                            changes may suggestive bile gastritis. Recommendation:           - Patient has a contact number available for                            emergencies. The signs and symptoms of potential                            delayed complications were discussed with the                            patient. Return to normal activities tomorrow.                            Written discharge instructions were provided to the                            patient.                           - Resume previous diet.                           - Continue present medications.                           - Await pathology results.                           - Further recommendations pending biopsy results. Matai Carpenito E. Cherryl Corona, MD 08/19/2023 10:03:57 AM This report has been signed electronically.

## 2023-08-20 ENCOUNTER — Telehealth: Payer: Self-pay

## 2023-08-20 NOTE — Telephone Encounter (Signed)
 Unable to reach pt.  Left HIPAA compliant voicemail.

## 2023-08-22 LAB — SURGICAL PATHOLOGY

## 2023-08-24 ENCOUNTER — Ambulatory Visit: Payer: Self-pay | Admitting: Gastroenterology

## 2023-08-24 NOTE — Progress Notes (Signed)
 Yolanda Keller,  The biopsies of your duodenum showed benign inflammatory changes, most likely related to stomach acid exposure.  No evidence of celiac disease. The biopsies taken from your stomach were notable for mild reactive gastropathy which is a common finding and often related to use of certain medications (usually NSAIDs), but there was no evidence of Helicobacter pylori infection. This common finding is not felt to necessarily be a cause of any particular symptom and there is no specific treatment or further evaluation recommended. The biopsies of your esophagus were normal.  There was no evidence of reflux esophagitis or eosinophilic esophagitis.
